# Patient Record
Sex: Female | Born: 1950 | Race: White | Hispanic: No | Marital: Married | Smoking: Never smoker
Health system: Southern US, Community
[De-identification: ages and names within clinical notes are randomized; demographics above are authoritative.]

## PROBLEM LIST (undated history)

## (undated) DIAGNOSIS — K760 Fatty (change of) liver, not elsewhere classified: Secondary | ICD-10-CM

## (undated) DIAGNOSIS — H3560 Retinal hemorrhage, unspecified eye: Secondary | ICD-10-CM

## (undated) DIAGNOSIS — M810 Age-related osteoporosis without current pathological fracture: Secondary | ICD-10-CM

## (undated) DIAGNOSIS — M84376A Stress fracture, unspecified foot, initial encounter for fracture: Secondary | ICD-10-CM

## (undated) HISTORY — DX: Age-related osteoporosis without current pathological fracture: M81.0

## (undated) HISTORY — DX: Fatty (change of) liver, not elsewhere classified: K76.0

## (undated) HISTORY — DX: Stress fracture, unspecified foot, initial encounter for fracture: M84.376A

## (undated) HISTORY — DX: Retinal hemorrhage, unspecified eye: H35.60

## (undated) HISTORY — PX: BREAST LUMPECTOMY: SHX2

---

## 2001-10-01 ENCOUNTER — Other Ambulatory Visit: Admission: RE | Admit: 2001-10-01 | Discharge: 2001-10-01 | Payer: Self-pay | Admitting: Radiology

## 2004-07-18 ENCOUNTER — Other Ambulatory Visit: Admission: RE | Admit: 2004-07-18 | Discharge: 2004-07-18 | Payer: Self-pay | Admitting: Gynecology

## 2009-01-22 ENCOUNTER — Ambulatory Visit (HOSPITAL_COMMUNITY): Admission: RE | Admit: 2009-01-22 | Discharge: 2009-01-22 | Payer: Self-pay | Admitting: Gynecology

## 2012-06-18 ENCOUNTER — Other Ambulatory Visit (HOSPITAL_COMMUNITY): Payer: Self-pay | Admitting: Specialist

## 2012-06-18 DIAGNOSIS — M79604 Pain in right leg: Secondary | ICD-10-CM

## 2012-06-18 DIAGNOSIS — M79671 Pain in right foot: Secondary | ICD-10-CM

## 2012-06-21 ENCOUNTER — Encounter (HOSPITAL_COMMUNITY)
Admission: RE | Admit: 2012-06-21 | Discharge: 2012-06-21 | Disposition: A | Discharge status: Home / Self Care | Payer: Managed Care, Other (non HMO) | Source: Ambulatory Visit | Attending: Specialist | Admitting: Specialist

## 2012-06-21 DIAGNOSIS — M79671 Pain in right foot: Secondary | ICD-10-CM

## 2012-06-21 DIAGNOSIS — M79609 Pain in unspecified limb: Secondary | ICD-10-CM | POA: Insufficient documentation

## 2012-06-21 DIAGNOSIS — M79604 Pain in right leg: Secondary | ICD-10-CM

## 2012-06-21 MED ORDER — TECHNETIUM TC 99M MEDRONATE IV KIT
25.0000 | PACK | Freq: Once | INTRAVENOUS | Status: AC | PRN
  Administered 2012-06-21: 25 via INTRAVENOUS

## 2012-07-01 ENCOUNTER — Other Ambulatory Visit: Payer: Self-pay | Admitting: Specialist

## 2012-07-01 DIAGNOSIS — M79671 Pain in right foot: Secondary | ICD-10-CM

## 2012-07-04 ENCOUNTER — Ambulatory Visit
Admission: RE | Admit: 2012-07-04 | Discharge: 2012-07-04 | Disposition: A | Discharge status: Home / Self Care | Payer: Managed Care, Other (non HMO) | Source: Ambulatory Visit | Attending: Specialist | Admitting: Specialist

## 2012-07-04 DIAGNOSIS — M79671 Pain in right foot: Secondary | ICD-10-CM

## 2014-04-28 ENCOUNTER — Other Ambulatory Visit: Payer: Self-pay | Admitting: Endocrinology

## 2014-04-28 ENCOUNTER — Encounter: Payer: Self-pay | Admitting: Gastroenterology

## 2014-04-28 DIAGNOSIS — R51 Headache: Secondary | ICD-10-CM

## 2014-05-04 ENCOUNTER — Ambulatory Visit
Admission: RE | Admit: 2014-05-04 | Discharge: 2014-05-04 | Disposition: A | Discharge status: Home / Self Care | Payer: Managed Care, Other (non HMO) | Source: Ambulatory Visit | Attending: Endocrinology | Admitting: Endocrinology

## 2014-05-04 ENCOUNTER — Encounter (INDEPENDENT_AMBULATORY_CARE_PROVIDER_SITE_OTHER): Payer: Self-pay

## 2014-05-04 DIAGNOSIS — R51 Headache: Secondary | ICD-10-CM

## 2014-05-04 MED ORDER — IOHEXOL 300 MG/ML  SOLN
75.0000 mL | Freq: Once | INTRAMUSCULAR | Status: AC | PRN
  Administered 2014-05-04: 75 mL via INTRAVENOUS

## 2014-06-14 ENCOUNTER — Emergency Department (HOSPITAL_BASED_OUTPATIENT_CLINIC_OR_DEPARTMENT_OTHER)
Admission: EM | Admit: 2014-06-14 | Discharge: 2014-06-14 | Disposition: A | Discharge status: Home / Self Care | Payer: Managed Care, Other (non HMO) | Attending: Emergency Medicine | Admitting: Emergency Medicine

## 2014-06-14 ENCOUNTER — Encounter (HOSPITAL_BASED_OUTPATIENT_CLINIC_OR_DEPARTMENT_OTHER): Payer: Self-pay | Admitting: Emergency Medicine

## 2014-06-14 DIAGNOSIS — R110 Nausea: Secondary | ICD-10-CM | POA: Insufficient documentation

## 2014-06-14 DIAGNOSIS — Z79899 Other long term (current) drug therapy: Secondary | ICD-10-CM | POA: Insufficient documentation

## 2014-06-14 DIAGNOSIS — R509 Fever, unspecified: Secondary | ICD-10-CM | POA: Insufficient documentation

## 2014-06-14 DIAGNOSIS — R197 Diarrhea, unspecified: Secondary | ICD-10-CM | POA: Insufficient documentation

## 2014-06-14 LAB — COMPREHENSIVE METABOLIC PANEL
ALT: 20 U/L (ref 0–35)
AST: 13 U/L (ref 0–37)
Albumin: 3.4 g/dL — ABNORMAL LOW (ref 3.5–5.2)
Alkaline Phosphatase: 65 U/L (ref 39–117)
Anion gap: 11 (ref 5–15)
BILIRUBIN TOTAL: 0.3 mg/dL (ref 0.3–1.2)
BUN: 14 mg/dL (ref 6–23)
CALCIUM: 9.6 mg/dL (ref 8.4–10.5)
CHLORIDE: 105 meq/L (ref 96–112)
CO2: 28 meq/L (ref 19–32)
Creatinine, Ser: 0.8 mg/dL (ref 0.50–1.10)
GFR, EST AFRICAN AMERICAN: 90 mL/min — AB (ref >90)
GFR, EST NON AFRICAN AMERICAN: 77 mL/min — AB (ref >90)
GLUCOSE: 118 mg/dL — AB (ref 70–99)
Potassium: 3.8 mEq/L (ref 3.7–5.3)
SODIUM: 144 meq/L (ref 137–147)
Total Protein: 7 g/dL (ref 6.0–8.3)

## 2014-06-14 LAB — URINALYSIS, ROUTINE W REFLEX MICROSCOPIC
BILIRUBIN URINE: NEGATIVE
GLUCOSE, UA: NEGATIVE mg/dL
HGB URINE DIPSTICK: NEGATIVE
KETONES UR: NEGATIVE mg/dL
Leukocytes, UA: NEGATIVE
Nitrite: NEGATIVE
PH: 7 (ref 5.0–8.0)
PROTEIN: NEGATIVE mg/dL
Specific Gravity, Urine: 1.015 (ref 1.005–1.030)
Urobilinogen, UA: 0.2 mg/dL (ref 0.0–1.0)

## 2014-06-14 LAB — CBC
HCT: 36 % (ref 36.0–46.0)
HEMOGLOBIN: 12.2 g/dL (ref 12.0–15.0)
MCH: 30.9 pg (ref 26.0–34.0)
MCHC: 33.9 g/dL (ref 30.0–36.0)
MCV: 91.1 fL (ref 78.0–100.0)
PLATELETS: 296 10*3/uL (ref 150–400)
RBC: 3.95 MIL/uL (ref 3.87–5.11)
RDW: 12.6 % (ref 11.5–15.5)
WBC: 8 10*3/uL (ref 4.0–10.5)

## 2014-06-14 LAB — I-STAT CG4 LACTIC ACID, ED: Lactic Acid, Venous: 0.48 mmol/L — ABNORMAL LOW (ref 0.5–2.2)

## 2014-06-14 MED ORDER — ONDANSETRON 4 MG PO TBDP: ORAL_TABLET | ORAL | Status: DC

## 2014-06-14 MED ORDER — SODIUM CHLORIDE 0.9 % IV BOLUS (SEPSIS)
1000.0000 mL | Freq: Once | INTRAVENOUS | Status: AC
Start: 2014-06-14 — End: 2014-06-14
  Administered 2014-06-14: 1000 mL via INTRAVENOUS

## 2014-06-14 MED ORDER — SODIUM CHLORIDE 0.9 % IV BOLUS (SEPSIS)
1000.0000 mL | Freq: Once | INTRAVENOUS | Status: AC
  Administered 2014-06-14: 1000 mL via INTRAVENOUS

## 2014-06-14 MED ORDER — ONDANSETRON HCL 4 MG/2ML IJ SOLN
4.0000 mg | Freq: Once | INTRAMUSCULAR | Status: AC
  Administered 2014-06-14: 4 mg via INTRAVENOUS
  Filled 2014-06-14: qty 2

## 2014-06-14 NOTE — ED Notes (Signed)
Pt states diarrhea since last Saturday along with nausea, no vomiting.  Called PMD on Tuesday and given a Rx for Promethazine for nausea.  Pt states decreased appetite and estimated weight loss of 10lbs in past week.  Pt denies abdominal pain or cramping.

## 2014-06-14 NOTE — Discharge Instructions (Signed)

## 2014-06-14 NOTE — ED Notes (Signed)
States that she has been having nausea and diarrhea for several days. States that she has lost 10 lbs this week.

## 2014-06-14 NOTE — ED Provider Notes (Signed)
CSN: [id number]     Arrival date & time 06/14/14  1325 History   First MD Initiated Contact with Patient 06/14/14 1339     Chief Complaint  Patient presents with  . Diarrhea     (Consider location/radiation/quality/duration/timing/severity/associated sxs/prior Treatment) Patient is a 63 y.o. female presenting with diarrhea. The history is provided by the patient.  Diarrhea Quality:  Semi-solid and watery Severity:  Moderate Onset quality:  Sudden Number of episodes:  9 Timing:  Constant Progression:  Improving Relieved by:  Nothing Worsened by:  Nothing tried Associated symptoms: fever (101 first 4 days of her illness, none in 5 days)   Associated symptoms: no abdominal pain, no chills, no headaches, no myalgias, no URI and no vomiting   Risk factors: no recent antibiotic use, no sick contacts, no suspicious food intake and no travel to endemic areas     History reviewed. No pertinent past medical history. Past Surgical History  Procedure Laterality Date  . Breast lumpectomy     No family history on file. History  Substance Use Topics  . Smoking status: Never Smoker   . Smokeless tobacco: Not on file  . Alcohol Use: Yes     Comment: ocassional   OB History   Grav Para Term Preterm Abortions TAB SAB Ect Mult Living                 Review of Systems  Constitutional: Positive for fever (101 first 4 days of her illness, none in 5 days). Negative for chills.  Respiratory: Negative for cough and shortness of breath.   Cardiovascular: Negative for chest pain and leg swelling.  Gastrointestinal: Positive for nausea and diarrhea. Negative for vomiting and abdominal pain.  Musculoskeletal: Negative for myalgias.  Neurological: Negative for headaches.  All other systems reviewed and are negative.     Allergies  Review of patient's allergies indicates no known allergies.  Home Medications   Prior to Admission medications   Medication Sig Start Date End Date Taking?  Authorizing Provider  raloxifene (EVISTA) 60 MG tablet Take 60 mg by mouth daily.   Yes Historical Provider, MD   BP 143/88  Pulse 76  Temp(Src) 98.1 F (36.7 C) (Oral)  Resp 16  Ht 5\' 3"  (1.6 m)  Wt 132 lb (59.875 kg)  BMI 23.39 kg/m2  SpO2 98% Physical Exam  Nursing note and vitals reviewed. Constitutional: She is oriented to person, place, and time. She appears well-developed and well-nourished. No distress.  HENT:  Head: Normocephalic and atraumatic.  Mouth/Throat: Oropharynx is clear and moist. No oropharyngeal exudate.  Eyes: EOM are normal. Pupils are equal, round, and reactive to light.  Neck: Normal range of motion. Neck supple.  Cardiovascular: Normal rate and regular rhythm.  Exam reveals no friction rub.   No murmur heard. Pulmonary/Chest: Effort normal and breath sounds normal. No respiratory distress. She has no wheezes. She has no rales.  Abdominal: Soft. She exhibits no distension. There is no tenderness. There is no rebound.  Musculoskeletal: Normal range of motion. She exhibits no edema.  Neurological: She is alert and oriented to person, place, and time. No cranial nerve deficit. She exhibits normal muscle tone. Coordination normal.  Skin: No rash noted. She is not diaphoretic.    ED Course  Procedures (including critical care time) Labs Review Labs Reviewed  COMPREHENSIVE METABOLIC PANEL - Abnormal; Notable for the following:    Glucose, Bld 118 (*)    Albumin 3.4 (*)    GFR calc  non Af Amer 77 (*)    GFR calc Af Amer 90 (*)    All other components within normal limits  I-STAT CG4 LACTIC ACID, ED - Abnormal; Notable for the following:    Lactic Acid, Venous 0.48 (*)    All other components within normal limits  CBC  URINALYSIS, ROUTINE W REFLEX MICROSCOPIC    Imaging Review No results found.   EKG Interpretation None      MDM   Final diagnoses:  Diarrhea    14F here with diarrhea. Present for past 9 days, slowly improving. Nausea also  but no vomiting. Had fevers for first few days. No fevers, no dysuria. No abdominal pain. Decreased urination. Patient denies recent travel, antibiotic use, drinking unfiltered water.  Here with benign exam, stable vitals. Will check labs. Zofran given - PCP had called in phenergan, but symptoms not improved with phenergan. Feeling better with Zofran. Labs ok. Can f/u with Dr. Evlyn Kanner in next 1-2 days. Stable for discharge.  Elwin Mocha, MD 06/14/14 [phone]

## 2014-06-22 ENCOUNTER — Ambulatory Visit (AMBULATORY_SURGERY_CENTER): Payer: Self-pay | Admitting: *Deleted

## 2014-06-22 VITALS — Ht 63.5 in | Wt 133.2 lb

## 2014-06-22 DIAGNOSIS — Z1211 Encounter for screening for malignant neoplasm of colon: Secondary | ICD-10-CM

## 2014-06-22 MED ORDER — NA SULFATE-K SULFATE-MG SULF 17.5-3.13-1.6 GM/177ML PO SOLN: ORAL | Status: DC

## 2014-06-22 NOTE — Progress Notes (Signed)
Patient denies any allergies to eggs or soy. Patient denies any problems with anesthesia/sedation. Patient denies any oxygen use at home and does not take any diet/weight loss medications. EMMI education assisgned to patient on colonoscopy, this was explained and instructions given to patient. 

## 2014-06-29 ENCOUNTER — Encounter: Payer: Self-pay | Admitting: Gastroenterology

## 2014-07-01 ENCOUNTER — Ambulatory Visit (AMBULATORY_SURGERY_CENTER): Payer: Managed Care, Other (non HMO) | Admitting: Gastroenterology

## 2014-07-01 ENCOUNTER — Encounter: Payer: Self-pay | Admitting: Gastroenterology

## 2014-07-01 VITALS — BP 118/68 | HR 58 | Temp 98.0°F | Resp 15 | Ht 63.5 in | Wt 133.0 lb

## 2014-07-01 DIAGNOSIS — Z1211 Encounter for screening for malignant neoplasm of colon: Secondary | ICD-10-CM

## 2014-07-01 MED ORDER — SODIUM CHLORIDE 0.9 % IV SOLN: 500.0000 mL | INTRAVENOUS | Status: DC

## 2014-07-01 NOTE — Patient Instructions (Signed)
Findings:  Normal Recommendations:  Repeat colonoscopy in 10 years  YOU HAD AN ENDOSCOPIC PROCEDURE TODAY AT THE [facility]: Refer to the procedure report that was given to you for any specific questions about what was found during the examination.  If the procedure report does not answer your questions, please call your gastroenterologist to clarify.  If you requested that your care partner not be given the details of your procedure findings, then the procedure report has been included in a sealed envelope for you to review at your convenience later.  YOU SHOULD EXPECT: Some feelings of bloating in the abdomen. Passage of more gas than usual.  Walking can help get rid of the air that was put into your GI tract during the procedure and reduce the bloating. If you had a lower endoscopy (such as a colonoscopy or flexible sigmoidoscopy) you may notice spotting of blood in your stool or on the toilet paper. If you underwent a bowel prep for your procedure, then you may not have a normal bowel movement for a few days.  DIET: Your first meal following the procedure should be a light meal and then it is ok to progress to your normal diet.  A half-sandwich or bowl of soup is an example of a good first meal.  Heavy or fried foods are harder to digest and may make you feel nauseous or bloated.  Likewise meals heavy in dairy and vegetables can cause extra gas to form and this can also increase the bloating.  Drink plenty of fluids but you should avoid alcoholic beverages for 24 hours.  ACTIVITY: Your care partner should take you home directly after the procedure.  You should plan to take it easy, moving slowly for the rest of the day.  You can resume normal activity the day after the procedure however you should NOT DRIVE or use heavy machinery for 24 hours (because of the sedation medicines used during the test).    SYMPTOMS TO REPORT IMMEDIATELY: A gastroenterologist can be reached at any hour.   During normal business hours, 8:30 AM to 5:00 PM Monday through Friday, call [phone].  After hours and on weekends, please call the GI answering service at [phone] who will take a message and have the physician on call contact you.   Following lower endoscopy (colonoscopy or flexible sigmoidoscopy):  Excessive amounts of blood in the stool  Significant tenderness or worsening of abdominal pains  Swelling of the abdomen that is new, acute  Fever of 100F or higher  Following upper endoscopy (EGD)  Vomiting of blood or coffee ground material  New chest pain or pain under the shoulder blades  Painful or persistently difficult swallowing  New shortness of breath  Fever of 100F or higher  Black, tarry-looking stools  FOLLOW UP: If any biopsies were taken you will be contacted by phone or by letter within the next 1-3 weeks.  Call your gastroenterologist if you have not heard about the biopsies in 3 weeks.  Our staff will call the home number listed on your records the next business day following your procedure to check on you and address any questions or concerns that you may have at that time regarding the information given to you following your procedure. This is a courtesy call and so if there is no answer at the home number and we have not heard from you through the emergency physician on call, we will assume that you have returned to your   regular daily activities without incident.  SIGNATURES/CONFIDENTIALITY: You and/or your care partner have signed paperwork which will be entered into your electronic medical record.  These signatures attest to the fact that that the information above on your After Visit Summary has been reviewed and is understood.  Full responsibility of the confidentiality of this discharge information lies with you and/or your care-partner.  Please follow all discharge instructions given to you by the recovery room nurse. If you have any questions or  problems after discharge please call one of the numbers listed above. You will receive a phone call in the am to see how you are doing and answer any questions you may have. Thank you for choosing [facility] for your health care needs. 

## 2014-07-01 NOTE — Op Note (Signed)
[facility] [address] [place], [address]   COLONOSCOPY PROCEDURE REPORT  PATIENT: Victoria Carlson, Victoria Carlson  MR#: [id number] BIRTHDATE: 25-Apr-1951 , 63  yrs. old GENDER: Female ENDOSCOPIST: Louis Meckel, MD REFERRED AO:[id number] Evlyn Kanner, M.D. PROCEDURE DATE:  07/01/2014 PROCEDURE:   Colonoscopy, diagnostic First Screening Colonoscopy - Avg.  risk and is 50 yrs.  old or older Yes.  Prior Negative Screening - Now for repeat screening. N/A  History of Adenoma - Now for follow-up colonoscopy & has been > or = to 3 yrs.  N/A  Polyps Removed Today? No.  Recommend repeat exam, <10 yrs? No. ASA CLASS:   Class I INDICATIONS:average risk screening. MEDICATIONS: MAC sedation, administered by CRNA and propofol (Diprivan)  IV  DESCRIPTION OF PROCEDURE:   After the risks benefits and alternatives of the procedure were thoroughly explained, informed consent was obtained.  A digital rectal exam revealed no abnormalities of the rectum.   The LB [id number]  endoscope was introduced through the anus and advanced to the cecum, which was identified by both the appendix and ileocecal valve. No adverse events experienced.   The quality of the prep was excellent using Suprep  The instrument was then slowly withdrawn as the colon was fully examined.      COLON FINDINGS: A normal appearing cecum, ileocecal valve, and appendiceal orifice were identified.  The ascending, hepatic flexure, transverse, splenic flexure, descending, sigmoid colon and rectum appeared unremarkable.  No polyps or cancers were seen. Retroflexed views revealed no abnormalities. The time to cecum=3 minutes 33 seconds.  Withdrawal time=9 minutes 14 seconds.  The scope was withdrawn and the procedure completed. COMPLICATIONS: There were no complications.  ENDOSCOPIC IMPRESSION: Normal colon  RECOMMENDATIONS: Continue current colorectal screening recommendations for "routine risk" patients with a  repeat colonoscopy in 10 years.   eSigned:  Louis Meckel, MD 07/01/2014 10:25 AM   cc:

## 2014-07-01 NOTE — Progress Notes (Signed)
A/ox3 pleased with MAC, report to Tracey RN 

## 2014-07-02 ENCOUNTER — Telehealth: Payer: Self-pay | Admitting: *Deleted

## 2014-07-02 NOTE — Telephone Encounter (Signed)
  Follow up Call-  Call back number 07/01/2014  Post procedure Call Back phone  # [phone]  Permission to leave phone message Yes     Patient questions:  Do you have a fever, pain , or abdominal swelling? No. Pain Score  0 *  Have you tolerated food without any problems? Yes.    Have you been able to return to your normal activities? Yes.    Do you have any questions about your discharge instructions: Diet   No. Medications  No. Follow up visit  No.  Do you have questions or concerns about your Care? No.  Actions: * If pain score is 4 or above: No action needed, pain <4.

## 2016-01-08 IMAGING — CT CT HEAD WO/W CM
1 of 2 series · 13 of 30 positions shown, 17 images · IV contrast (75CC OMNI 300)
Comparison: None.

CLINICAL DATA: Headache

EXAM:
CT HEAD WITHOUT AND WITH CONTRAST
TECHNIQUE: Contiguous axial images were obtained from the base of the skull
through the vertex without and with intravenous contrast
CONTRAST:  75mL OMNIPAQUE IOHEXOL 300 MG/ML  SOLN

[Series 32: 3d filtered head w/o · axial · non-contrast · 0.49mm/px · z∈[+13,+144]mm · 13 of 32 slices shown, 17 images]
[im 3/32  brain]
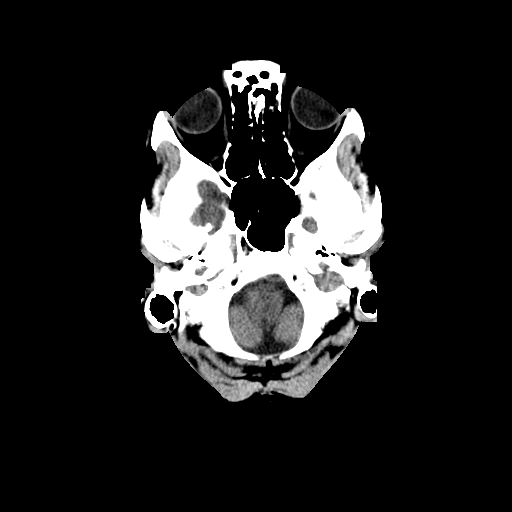
[im 3/32  bone]
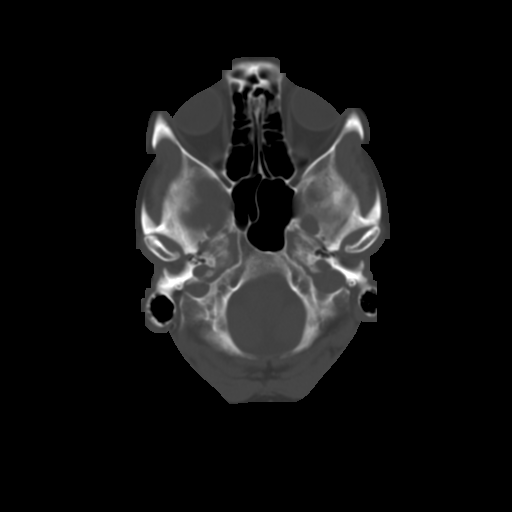
[im 5/32  brain]
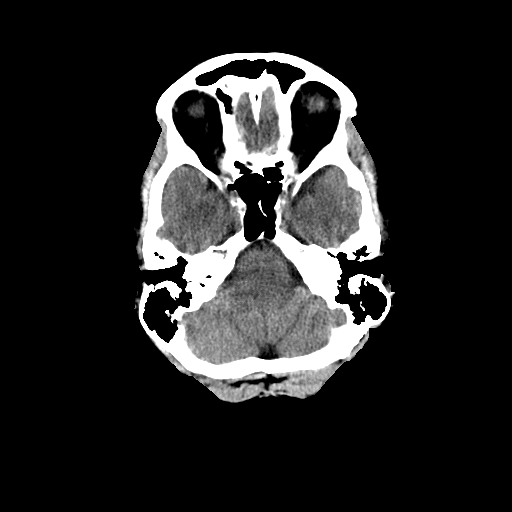
[im 7/32  brain]
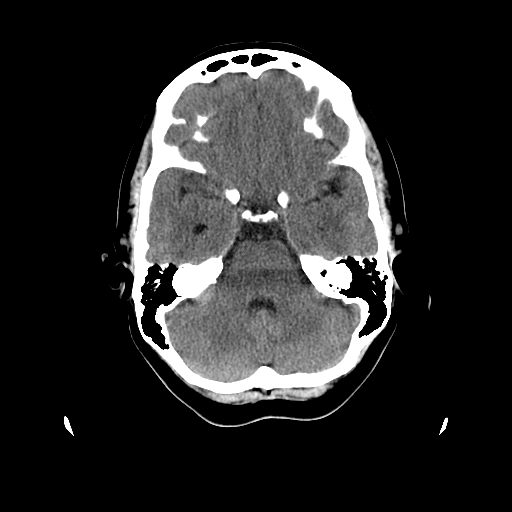
[im 9/32  brain]
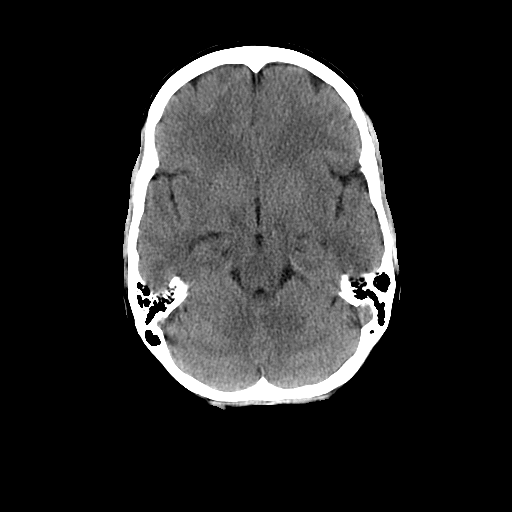
[im 12/32  brain]
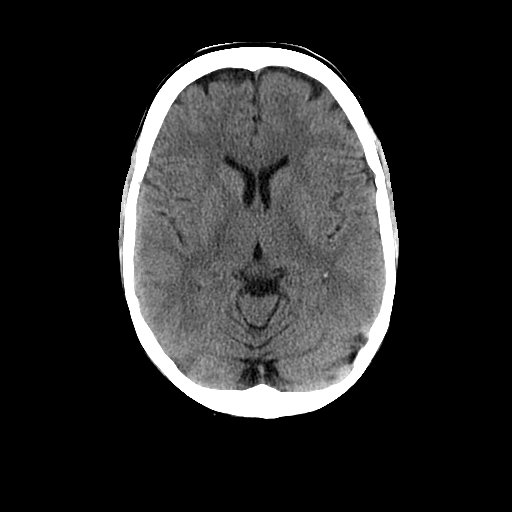
[im 12/32  bone]
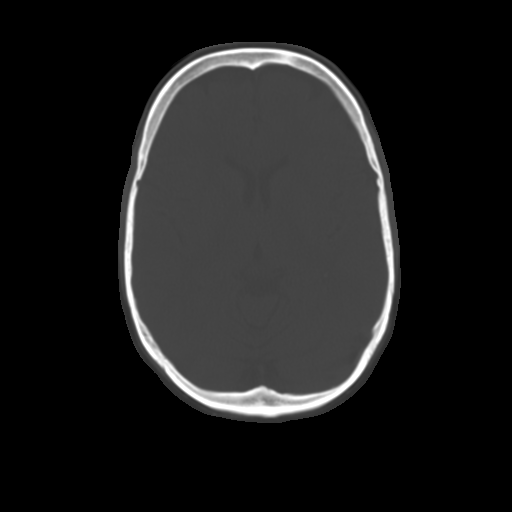
[im 14/32  brain]
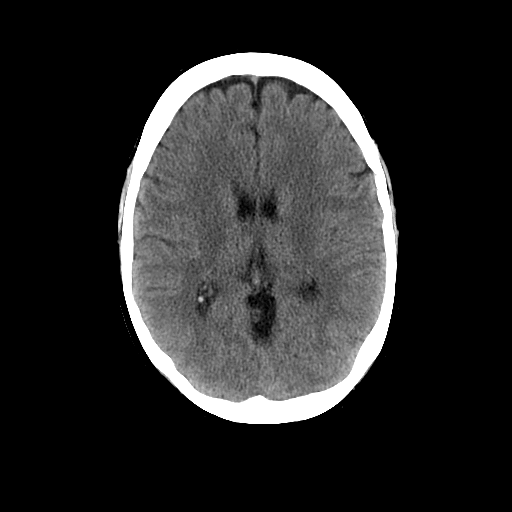
[im 16/32  brain]
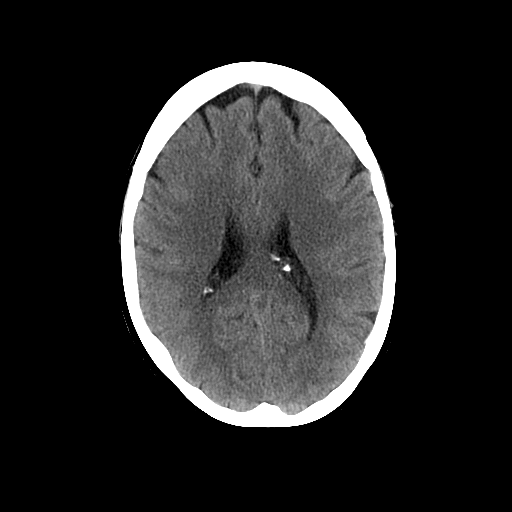
[im 18/32  brain]
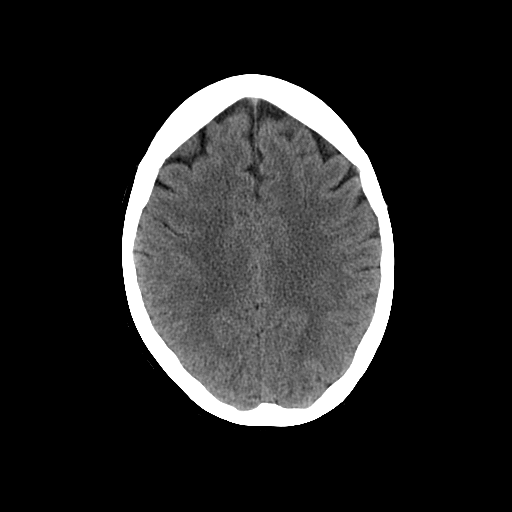
[im 20/32  brain]
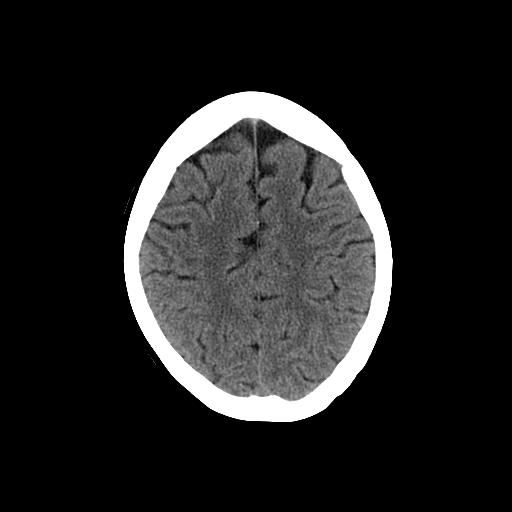
[im 20/32  bone]
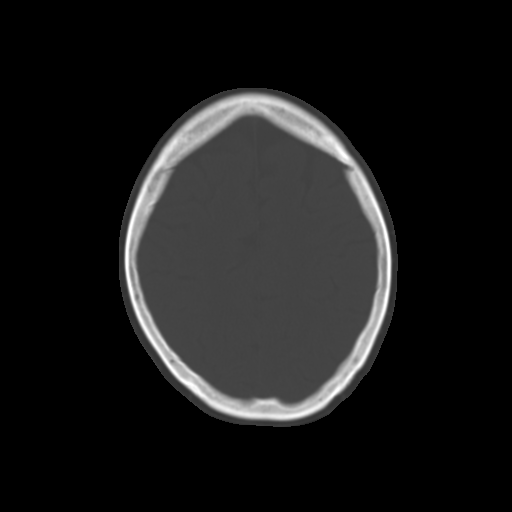
[im 23/32  brain]
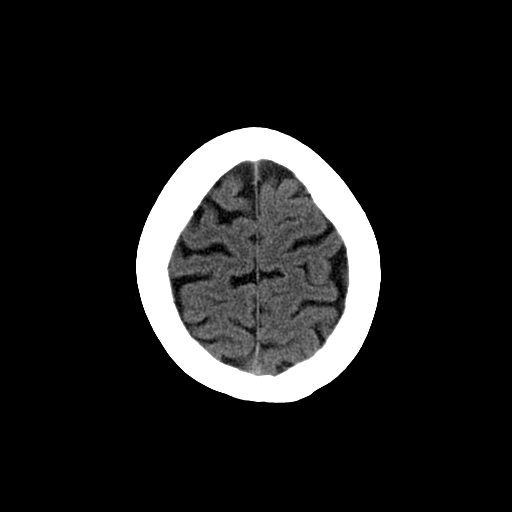
[im 25/32  brain]
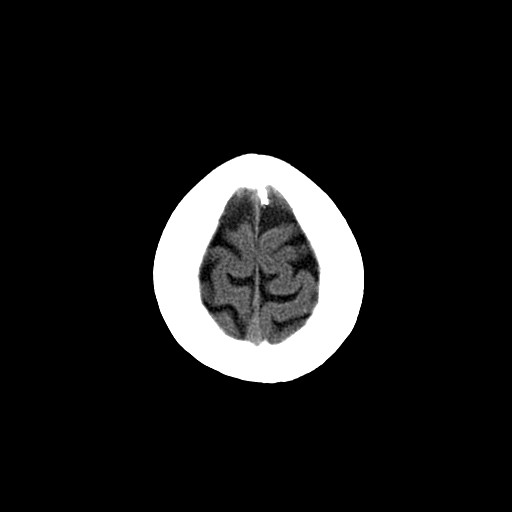
[im 27/32  brain]
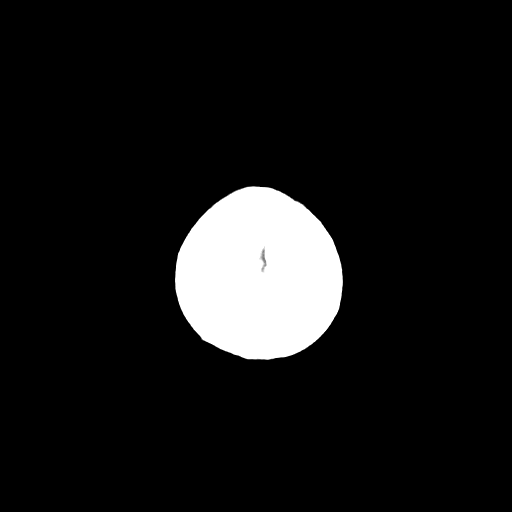
[im 29/32  brain]
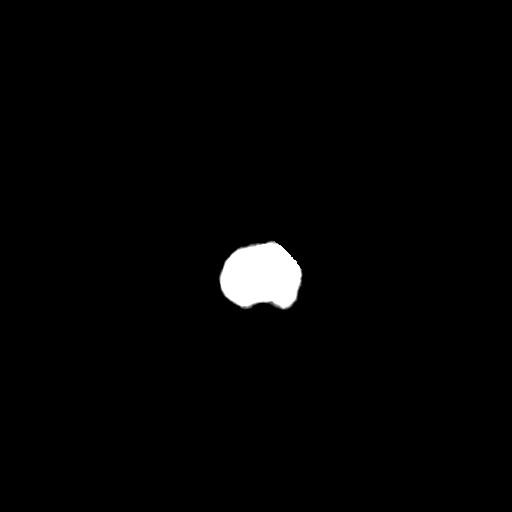
[im 29/32  bone]
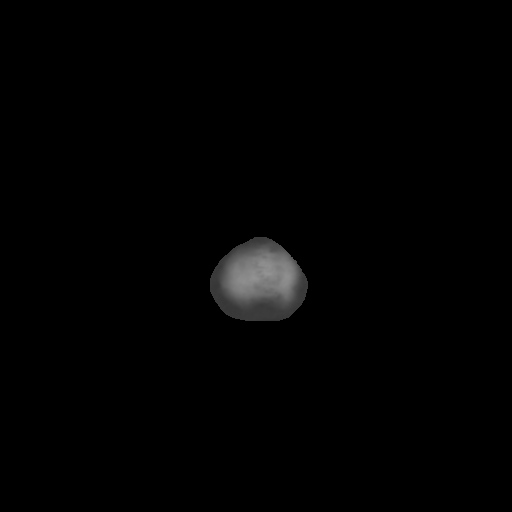

[13 of 30 positions shown; findings below may reference images not displayed]

FINDINGS: Ventricle size is normal. Negative for acute infarct. No significant
chronic ischemia. Negative for hemorrhage or mass.

Postcontrast imaging reveals no enhancing mass lesion. Note is made
of a small right transverse sinus and small right sigmoid sinus
compatible with congenital hypoplasia. No dural sinus thrombosis is
identified. Small filling defect left transverse sinus felt to be
arachnoid granulation.
IMPRESSION: Negative

## 2018-06-06 ENCOUNTER — Other Ambulatory Visit: Payer: Self-pay | Admitting: Endocrinology

## 2018-06-06 DIAGNOSIS — R740 Nonspecific elevation of levels of transaminase and lactic acid dehydrogenase [LDH]: Principal | ICD-10-CM

## 2018-06-06 DIAGNOSIS — R7401 Elevation of levels of liver transaminase levels: Secondary | ICD-10-CM

## 2018-08-06 ENCOUNTER — Ambulatory Visit
Admission: RE | Admit: 2018-08-06 | Discharge: 2018-08-06 | Disposition: A | Discharge status: Home / Self Care | Payer: Managed Care, Other (non HMO) | Source: Ambulatory Visit | Attending: Endocrinology | Admitting: Endocrinology

## 2018-08-06 DIAGNOSIS — R7401 Elevation of levels of liver transaminase levels: Secondary | ICD-10-CM

## 2018-08-06 DIAGNOSIS — R740 Nonspecific elevation of levels of transaminase and lactic acid dehydrogenase [LDH]: Principal | ICD-10-CM

## 2019-01-09 ENCOUNTER — Other Ambulatory Visit: Payer: Self-pay | Admitting: Obstetrics & Gynecology

## 2019-01-09 DIAGNOSIS — R1031 Right lower quadrant pain: Secondary | ICD-10-CM

## 2019-01-15 ENCOUNTER — Other Ambulatory Visit: Payer: Self-pay | Admitting: Obstetrics & Gynecology

## 2019-01-15 DIAGNOSIS — R1031 Right lower quadrant pain: Secondary | ICD-10-CM

## 2019-01-17 ENCOUNTER — Other Ambulatory Visit: Payer: Self-pay

## 2019-01-17 ENCOUNTER — Ambulatory Visit
Admission: RE | Admit: 2019-01-17 | Discharge: 2019-01-17 | Disposition: A | Discharge status: Home / Self Care | Payer: Medicare Other | Source: Ambulatory Visit | Attending: Obstetrics & Gynecology | Admitting: Obstetrics & Gynecology

## 2019-01-17 DIAGNOSIS — R1031 Right lower quadrant pain: Secondary | ICD-10-CM

## 2019-01-17 MED ORDER — IOPAMIDOL (ISOVUE-300) INJECTION 61%
100.0000 mL | Freq: Once | INTRAVENOUS | Status: AC | PRN
  Administered 2019-01-17: 100 mL via INTRAVENOUS

## 2020-03-27 IMAGING — US US ABDOMEN LIMITED
1 series · 14 of 25 positions shown · non-contrast
Comparison: Ultrasound 01/22/2009.

CLINICAL DATA: Transaminitis.

EXAM:
ULTRASOUND ABDOMEN LIMITED RIGHT UPPER QUADRANT

[Series 1: us abdomen limited · 0.17mm/px · 14 of 48 slices shown]
[im 1/48]
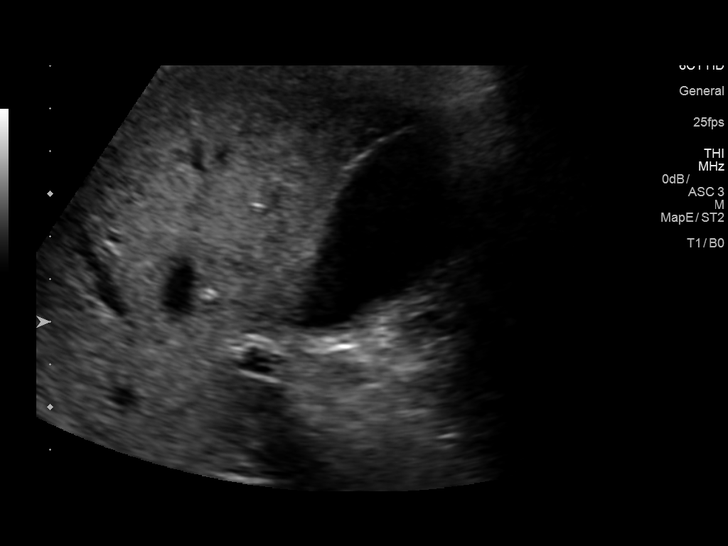
[im 4/48]
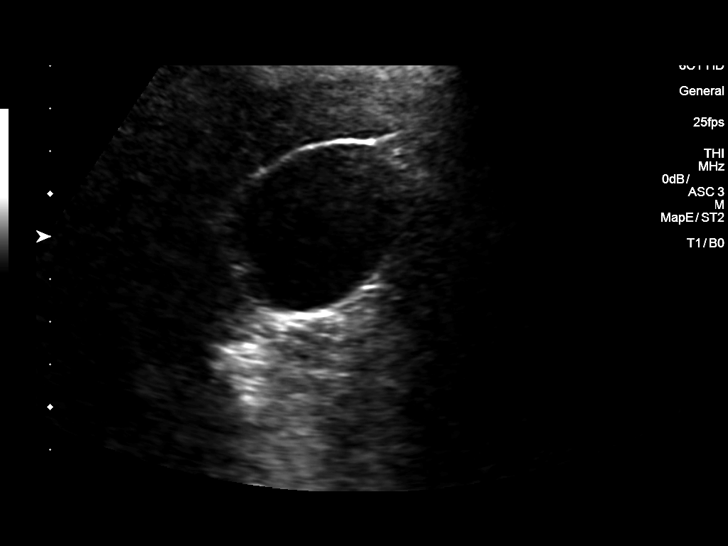
[im 8/48]
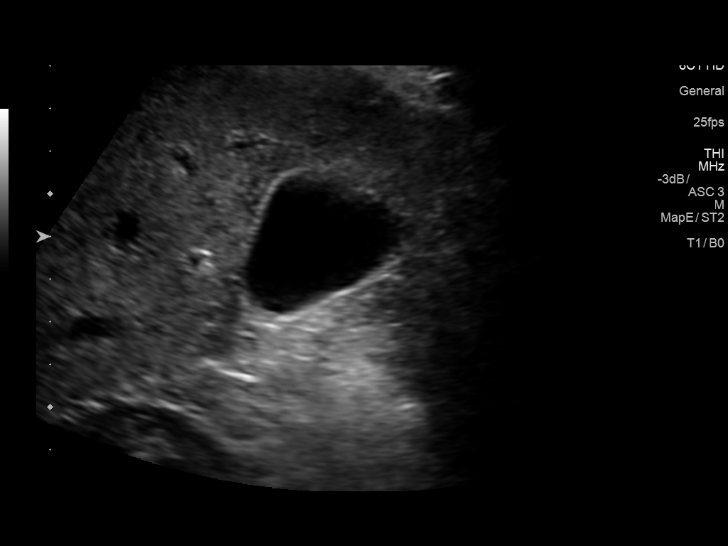
[im 12/48]
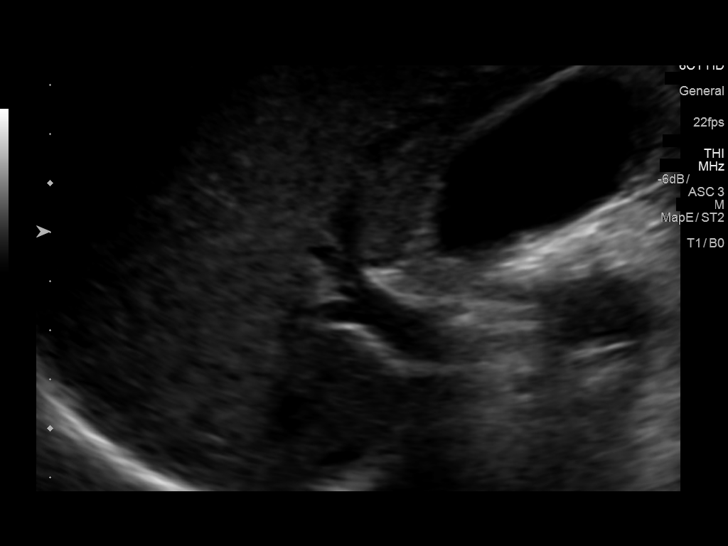
[im 16/48]
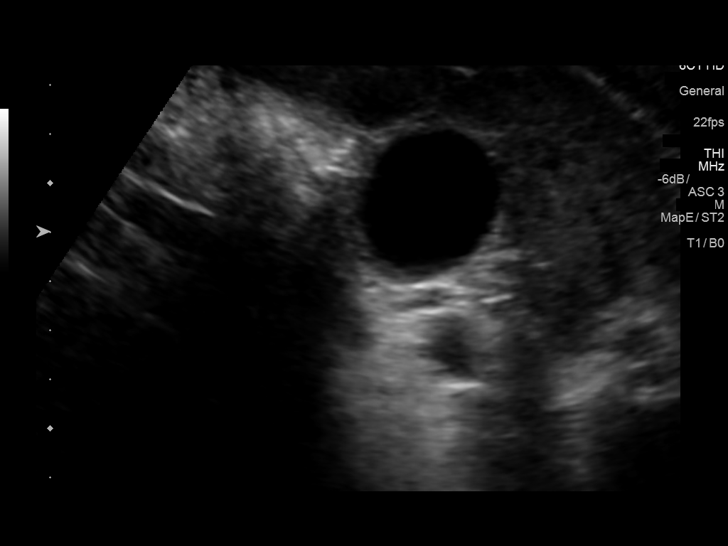
[im 18/48]
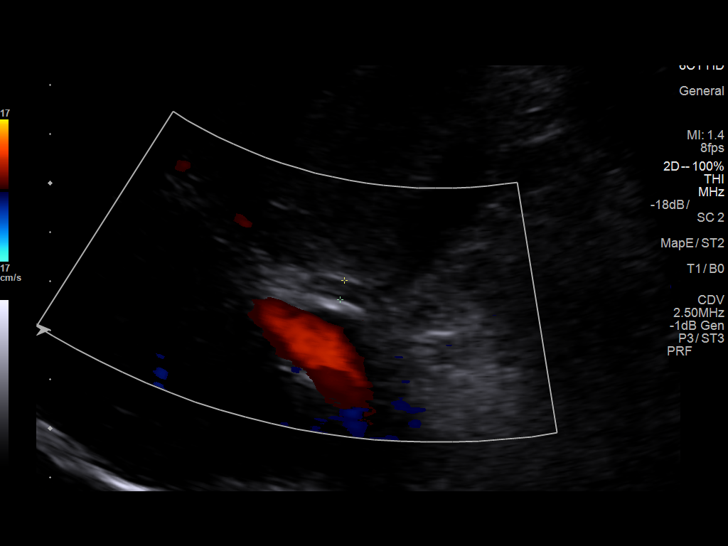
[im 22/48]
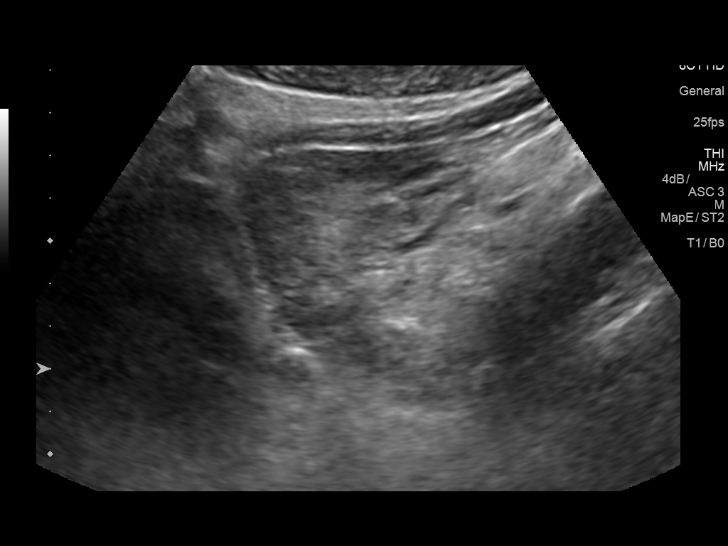
[im 26/48]
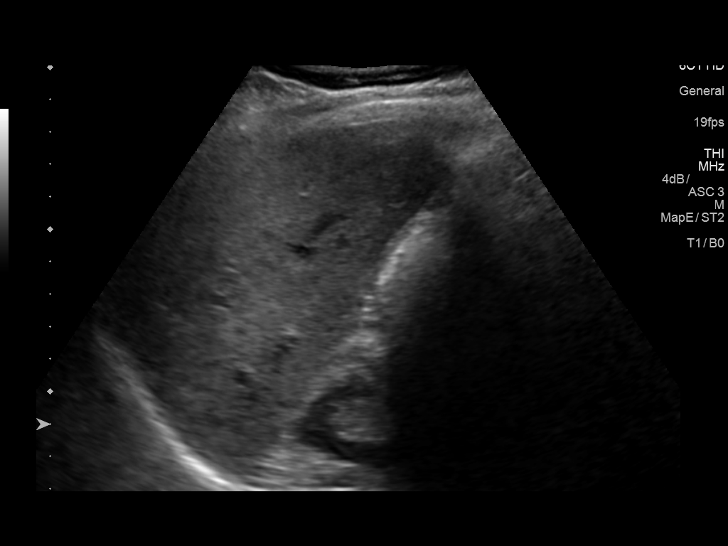
[im 30/48]
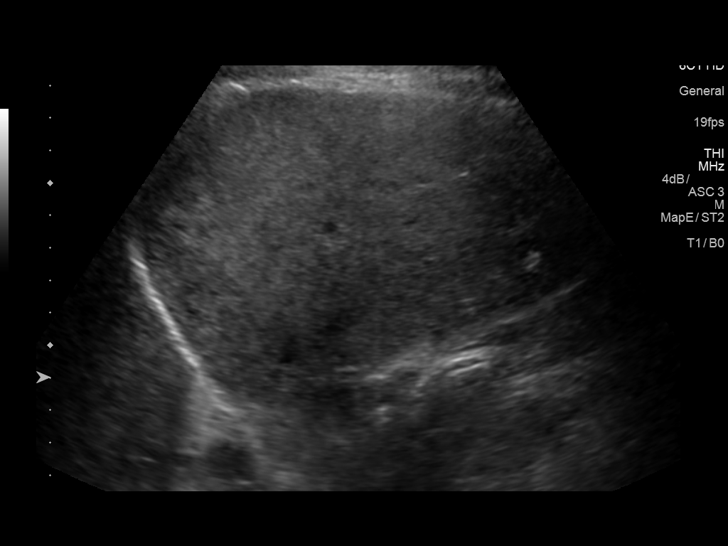
[im 32/48]
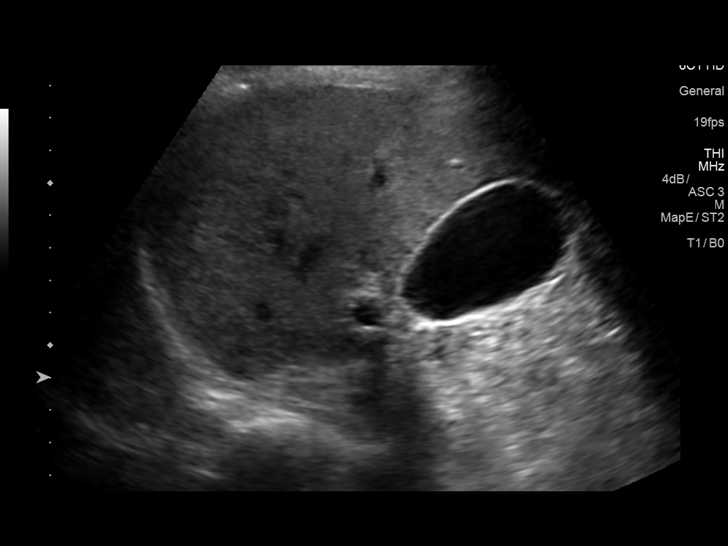
[im 36/48]
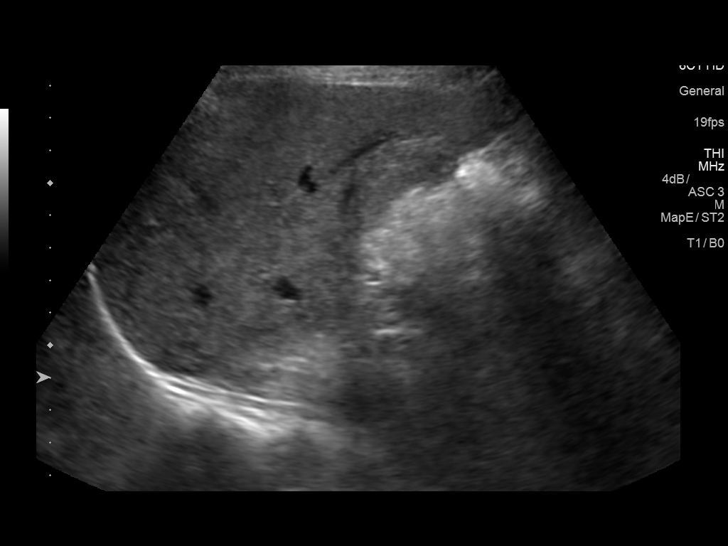
[im 40/48]
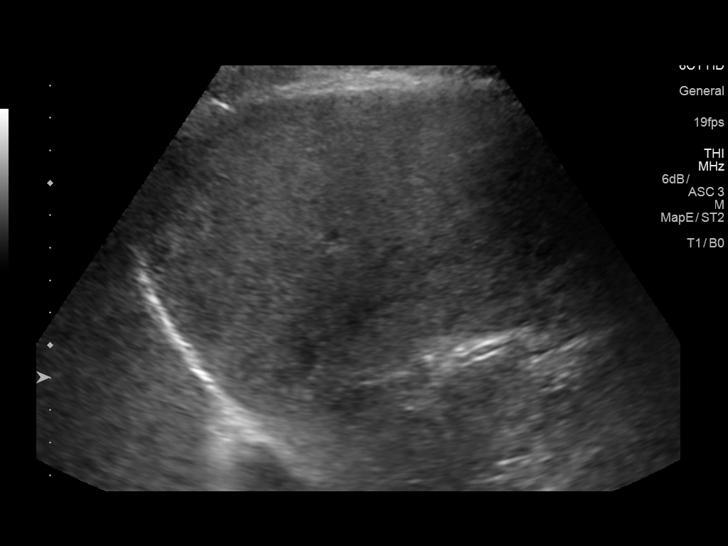
[im 44/48]
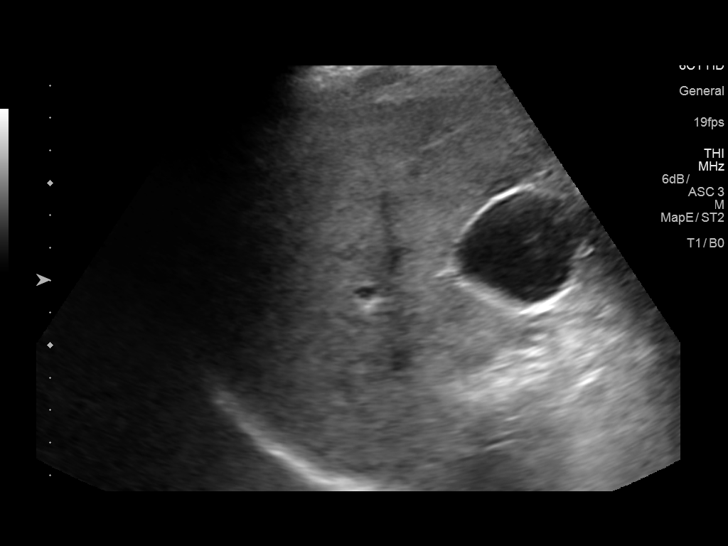
[im 48/48]
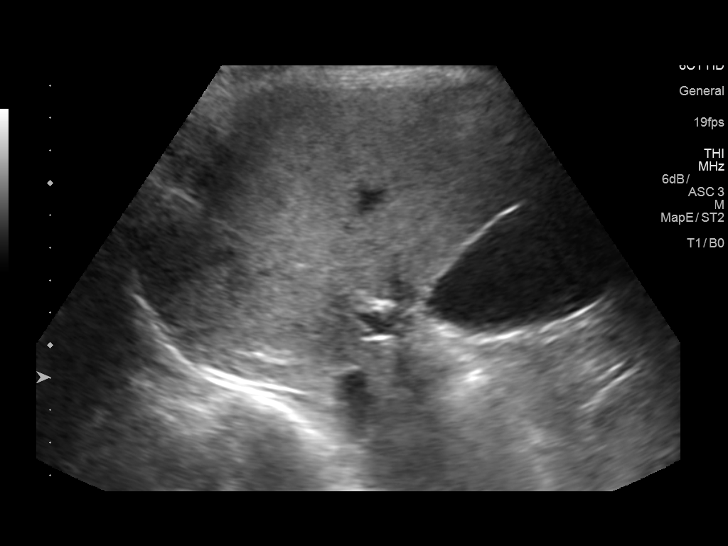

[14 of 25 positions shown; findings below may reference images not displayed]

FINDINGS: Gallbladder:

No gallstones or wall thickening visualized. No sonographic Murphy
sign noted by sonographer.

Common bile duct:

Diameter: 4.0 mm

Liver:

Increased echogenicity consistent fatty infiltration or
hepatocellular disease. No focal hepatic abnormality identified.
Portal vein is patent on color Doppler imaging with normal direction
of blood flow towards the liver.
IMPRESSION: 1. Increased hepatic echogenicity consistent fatty infiltration. No
focal hepatic abnormality identified.

2.  No gallstones or biliary distention.

## 2020-06-21 DIAGNOSIS — Z1231 Encounter for screening mammogram for malignant neoplasm of breast: Secondary | ICD-10-CM | POA: Diagnosis not present

## 2020-07-09 DIAGNOSIS — M810 Age-related osteoporosis without current pathological fracture: Secondary | ICD-10-CM | POA: Diagnosis not present

## 2020-07-09 DIAGNOSIS — N1831 Chronic kidney disease, stage 3a: Secondary | ICD-10-CM | POA: Diagnosis not present

## 2020-07-09 DIAGNOSIS — E049 Nontoxic goiter, unspecified: Secondary | ICD-10-CM | POA: Diagnosis not present

## 2020-07-22 DIAGNOSIS — M810 Age-related osteoporosis without current pathological fracture: Secondary | ICD-10-CM | POA: Diagnosis not present

## 2020-07-22 DIAGNOSIS — E049 Nontoxic goiter, unspecified: Secondary | ICD-10-CM | POA: Diagnosis not present

## 2020-07-22 DIAGNOSIS — K760 Fatty (change of) liver, not elsewhere classified: Secondary | ICD-10-CM | POA: Diagnosis not present

## 2020-07-22 DIAGNOSIS — Z0000 Encounter for general adult medical examination without abnormal findings: Secondary | ICD-10-CM | POA: Diagnosis not present

## 2020-07-22 DIAGNOSIS — R82998 Other abnormal findings in urine: Secondary | ICD-10-CM | POA: Diagnosis not present

## 2020-07-26 DIAGNOSIS — R1031 Right lower quadrant pain: Secondary | ICD-10-CM | POA: Diagnosis not present

## 2020-07-26 DIAGNOSIS — E049 Nontoxic goiter, unspecified: Secondary | ICD-10-CM | POA: Diagnosis not present

## 2020-08-24 ENCOUNTER — Encounter: Payer: Self-pay | Admitting: Gastroenterology

## 2020-08-24 ENCOUNTER — Ambulatory Visit: Payer: Medicare Other | Admitting: Gastroenterology

## 2020-08-24 VITALS — BP 132/70 | HR 75 | Ht 63.0 in | Wt 140.0 lb

## 2020-08-24 DIAGNOSIS — K529 Noninfective gastroenteritis and colitis, unspecified: Secondary | ICD-10-CM

## 2020-08-24 DIAGNOSIS — R1030 Lower abdominal pain, unspecified: Secondary | ICD-10-CM | POA: Diagnosis not present

## 2020-08-24 DIAGNOSIS — K760 Fatty (change of) liver, not elsewhere classified: Secondary | ICD-10-CM | POA: Diagnosis not present

## 2020-08-24 MED ORDER — DICYCLOMINE HCL 10 MG PO CAPS: 10.0000 mg | ORAL_CAPSULE | Freq: Three times a day (TID) | ORAL | 11 refills | Status: AC | PRN

## 2020-08-24 NOTE — Progress Notes (Signed)
History of Present Illness: This is a 69 year old female referred by [place], MD for the evaluation of lower abdominal pain, dyspepsia and frequent bowel movements.  She relates intermittent problems over the past couple years with episodic lower abdominal pain described as a "stomachache" often with cramping and frequently associated with multiple loose bowel movements which relieve the pain.  She cannot relate any particular food or activity however her symptoms often occur after meals.  Recent blood work performed Dr. Rinaldo Cloud office was unremarkable.  Imaging studies below.  She was placed on Nexium OTC by Dr. Evlyn Kanner and her symptoms have almost completely resolved.  She notes occasional mild lower abdominal pain associated with frequent bowel movements but she has not had any severe episodes of pain since beginning Nexium OTC.  She underwent colonoscopy in August 2015 which was normal. Denies weight loss, constipation,  change in stool caliber, melena, hematochezia, nausea, vomiting, dysphagia, reflux symptoms, chest pain.   CT AP 01/17/2019 No acute findings in the abdomen or pelvis. Specifically, no findings to explain the patient's history of right lower quadrant pain. The terminal ileum and appendix are normal. No right adnexal mass.  RUQ US IMPRESSION 08/06/2018: 1. Increased hepatic echogenicity consistent fatty infiltration. No focal hepatic abnormality identified. 2.  No gallstones or biliary distention    No Known Allergies Outpatient Medications Prior to Visit  Medication Sig Dispense Refill  . esomeprazole (NEXIUM) 20 MG capsule Take 20 mg by mouth daily at 12 noon.    . raloxifene (EVISTA) 60 MG tablet Take 60 mg by mouth daily.    Marland Kitchen VITAMIN D, CHOLECALCIFEROL, PO Take 1 tablet by mouth daily.     No facility-administered medications prior to visit.   Past Medical History:  Diagnosis Date  . Fatty liver   . Osteoporosis   . Retinal hemorrhage   . Stress fracture of  foot    Past Surgical History:  Procedure Laterality Date  . BREAST LUMPECTOMY     Social History   Socioeconomic History  . Marital status: Married    Spouse name: Not on file  . Number of children: Not on file  . Years of education: Not on file  . Highest education level: Not on file  Occupational History  . Not on file  Tobacco Use  . Smoking status: Never Smoker  . Smokeless tobacco: Never Used  Substance and Sexual Activity  . Alcohol use: Yes    Comment: ocassional; "maybe 2 glasses wine per month" per pt./RM  . Drug use: No  . Sexual activity: Not on file  Other Topics Concern  . Not on file  Social History Narrative  . Not on file   Social Determinants of Health   Financial Resource Strain:   . Difficulty of Paying Living Expenses: Not on file  Food Insecurity:   . Worried About Programme researcher, broadcasting/film/video in the Last Year: Not on file  . Ran Out of Food in the Last Year: Not on file  Transportation Needs:   . Lack of Transportation (Medical): Not on file  . Lack of Transportation (Non-Medical): Not on file  Physical Activity:   . Days of Exercise per Week: Not on file  . Minutes of Exercise per Session: Not on file  Stress:   . Feeling of Stress : Not on file  Social Connections:   . Frequency of Communication with Friends and Family: Not on file  . Frequency of Social Gatherings with Friends  and Family: Not on file  . Attends Religious Services: Not on file  . Active Member of Clubs or Organizations: Not on file  . Attends Banker Meetings: Not on file  . Marital Status: Not on file   Family History  Problem Relation Age of Onset  . Prostate cancer Father   . Colon cancer Neg Hx       Review of Systems: Pertinent positive and negative review of systems were noted in the above HPI section. All other review of systems were otherwise negative.   Physical Exam: General: Well developed, well nourished, no acute distress Head: Normocephalic and  atraumatic Eyes:  sclerae anicteric, EOMI Ears: Normal auditory acuity Mouth: Not examined, mask on during Covid-19 pandemic Neck: Supple, no masses or thyromegaly Lungs: Clear throughout to auscultation Heart: Regular rate and rhythm; no murmurs, rubs or bruits Abdomen: Soft, non tender and non distended. No masses, hepatosplenomegaly or hernias noted. Normal Bowel sounds Rectal: Not done Musculoskeletal: Symmetrical with no gross deformities  Skin: No lesions on visible extremities Pulses:  Normal pulses noted Extremities: No clubbing, cyanosis, edema or deformities noted Neurological: Alert oriented x 4, grossly nonfocal Cervical Nodes:  No significant cervical adenopathy Inguinal Nodes: No significant inguinal adenopathy Psychological:  Alert and cooperative. Normal mood and affect   Assessment and Recommendations:  1.  Episodic lower abdominal pain often following meals and associated with frequent bowel movements.  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 Possible GERD, gastritis or duodenitis contributing. Symptoms have substantially improved on Nexium OTC qd.  Add dicyclomine 10 mg p.o. 3 times daily taken before meals as needed.  She notes symptoms occur more often following her evening meal so she may use dicyclomine before her evening meal on a regular basis as needed.  If symptoms are well controlled she can discontinue Nexium in about 1 month and if symptoms return resume Nexium daily.  She is advised to follow-up with me for consideration of colonoscopy and EGD if symptoms are not completely controlled. If symptoms are controlled follow-up with Dr. Evlyn Kanner.  2. Hepatic steatosis. Recent LFTs were normal.  Long-term fat modified, carb modified diet.  Trend LFTs at regular office appointments with Dr. Evlyn Kanner.  3. CRC screening, average risk.  A 10-year interval colonoscopy is recommended in August 2025.   cc: Adrian Prince, MD [address] [place],  [place] [address]

## 2020-08-24 NOTE — Patient Instructions (Signed)
We have sent the following medications to your pharmacy for you to pick up at your convenience: dicyclomine.   Stay on Nexium daily x 1 month, then reduce to every other day x 2 weeks, then every 3rd day x 2 weeks then stop.  Thank you for choosing me and [facility].  Venita Lick. Pleas Koch., MD., Clementeen Graham

## 2020-09-04 ENCOUNTER — Ambulatory Visit: Payer: Medicare Other | Attending: Internal Medicine

## 2020-09-04 DIAGNOSIS — Z23 Encounter for immunization: Secondary | ICD-10-CM

## 2020-09-04 NOTE — Progress Notes (Signed)
   Covid-19 Vaccination Clinic  Name:  Victoria Carlson    MRN: [id number] DOB: [birth year]  09/04/2020  Victoria Carlson was observed post Covid-19 immunization for 15 minutes without incident. She was provided with Vaccine Information Sheet and instruction to access the V-Safe system.   Victoria Carlson was instructed to call 911 with any severe reactions post vaccine: Marland Kitchen Difficulty breathing  . Swelling of face and throat  . A fast heartbeat  . A bad rash all over body  . Dizziness and weakness

## 2020-09-06 DIAGNOSIS — H35351 Cystoid macular degeneration, right eye: Secondary | ICD-10-CM | POA: Diagnosis not present

## 2020-09-22 IMAGING — CT CT ABDOMEN AND PELVIS WITH CONTRAST
1 of 3 series · 13 of 32 positions shown, 18 images · IV contrast (APPLIED)
Comparison: None.

CLINICAL DATA: Nagging right lower quadrant pain since [REDACTED] last
year.

EXAM:
CT ABDOMEN AND PELVIS WITH CONTRAST
TECHNIQUE: Multidetector CT imaging of the abdomen and pelvis was performed
using the standard protocol following bolus administration of
intravenous contrast.
CONTRAST:  100mL G2DRY4-0WW IOPAMIDOL (G2DRY4-0WW) INJECTION 61%

[Series 2: abd/pelvis w/cm · axial · 0.71mm/px · z∈[-386,+8]mm · 13 of 91 slices shown, 18 images]
[im 6/91  soft-tissue]
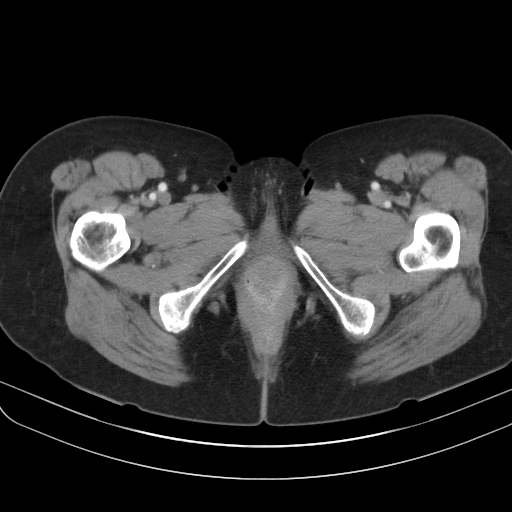
[im 6/91  bone]
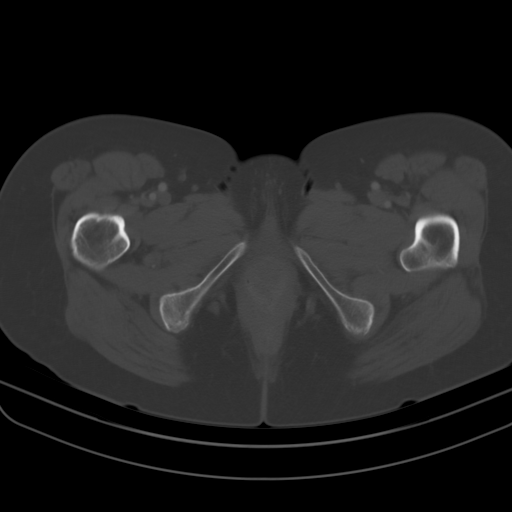
[im 16/91  soft-tissue]
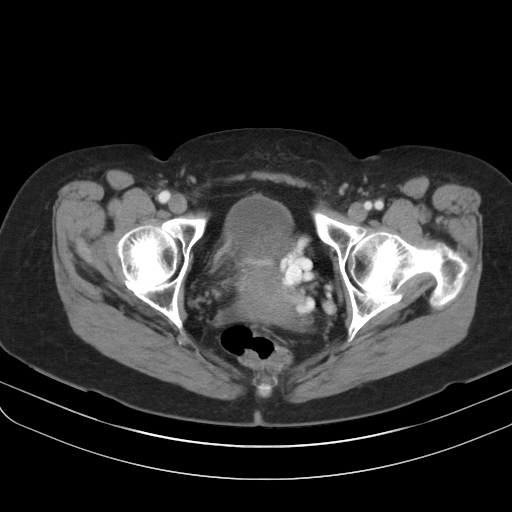
[im 22/91  soft-tissue]
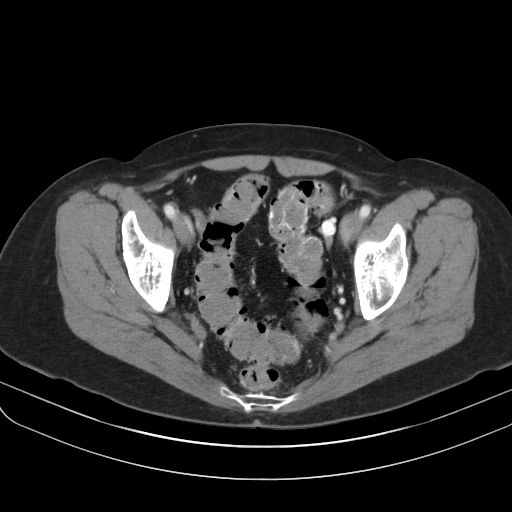
[im 27/91  soft-tissue]
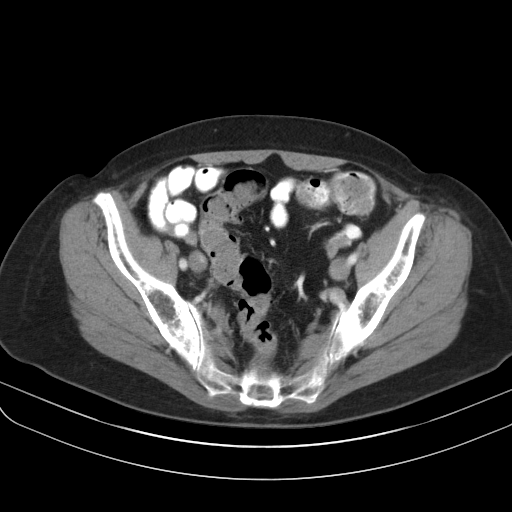
[im 38/91  soft-tissue]
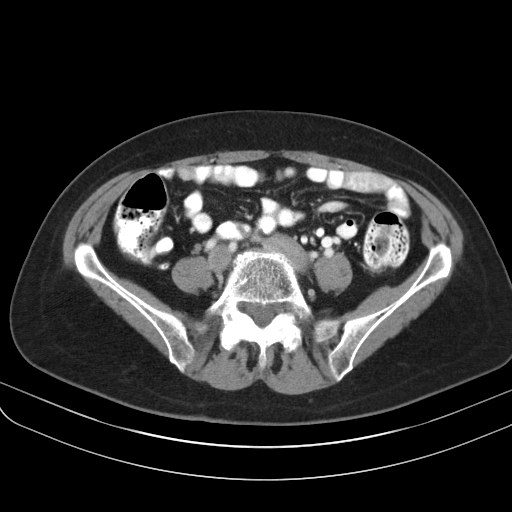
[im 43/91  soft-tissue]
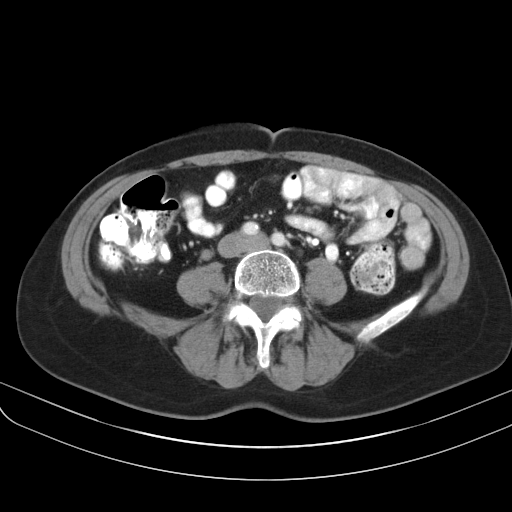
[im 48/91  soft-tissue]
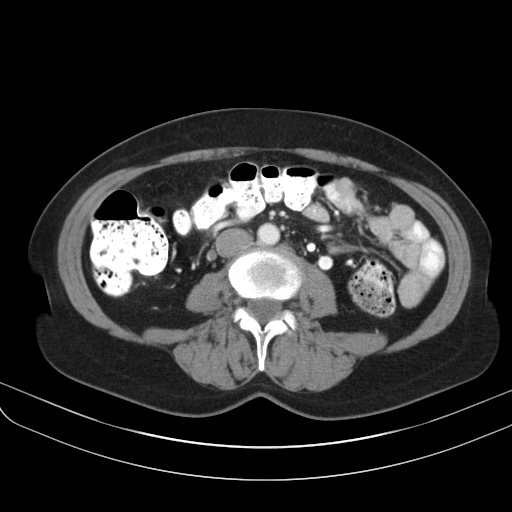
[im 59/91  soft-tissue]
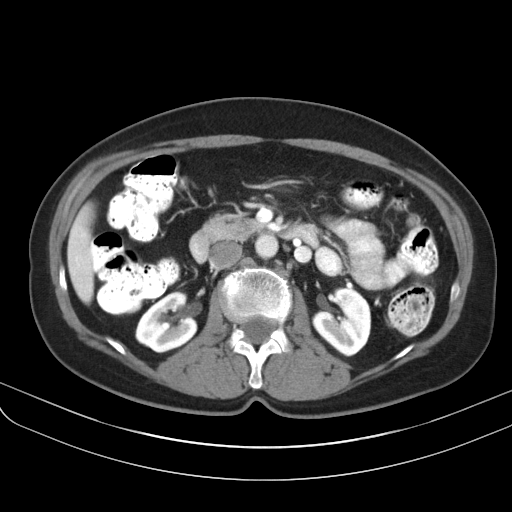
[im 64/91  soft-tissue]
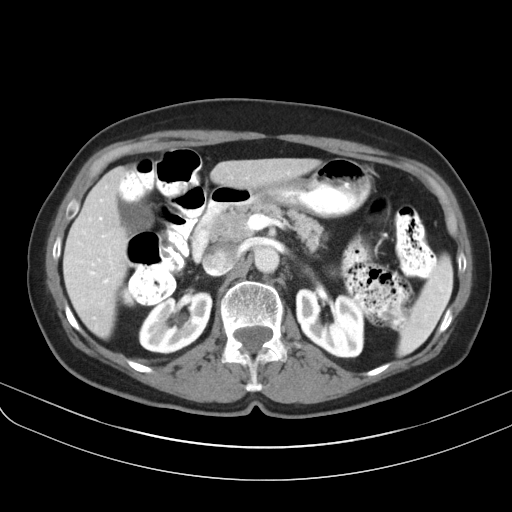
[im 64/91  bone]
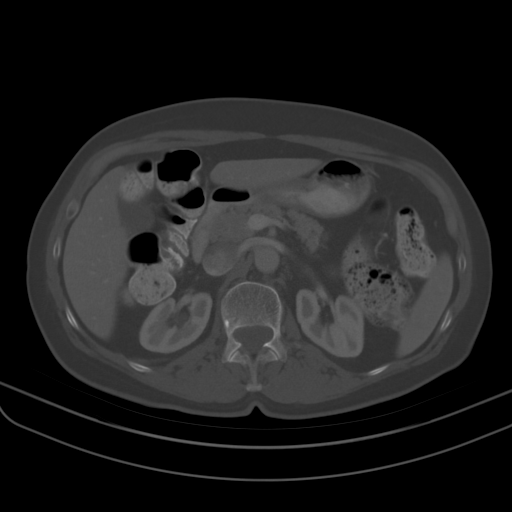
[im 69/91  soft-tissue]
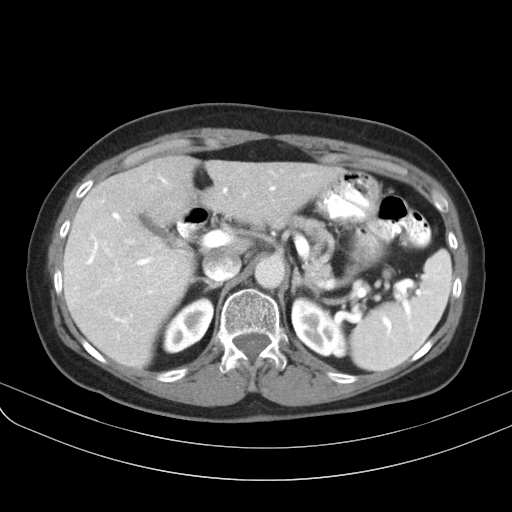
[im 69/91  lung]
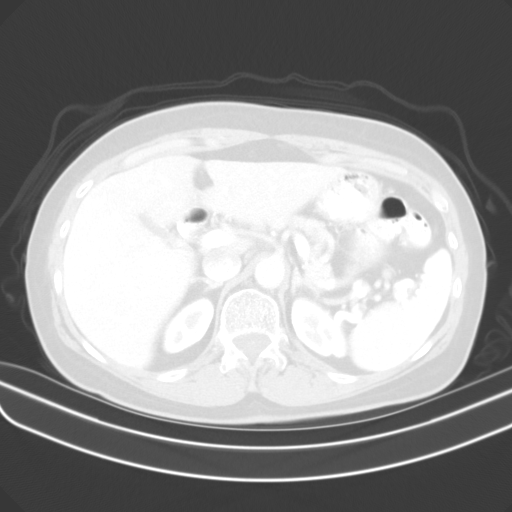
[im 75/91  lung]
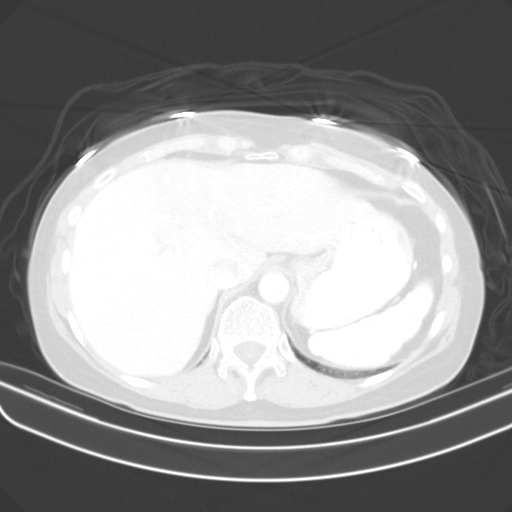
[im 80/91  soft-tissue]
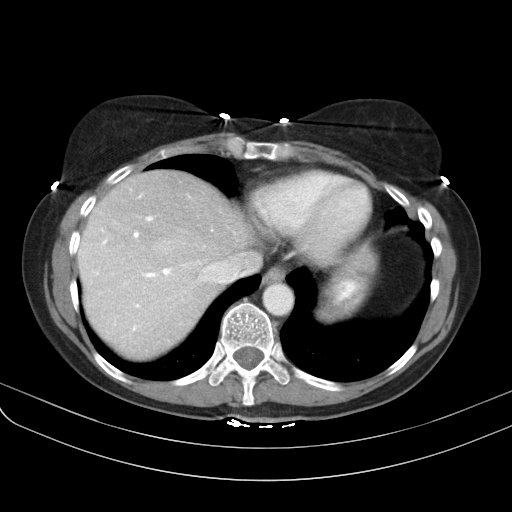
[im 80/91  lung]
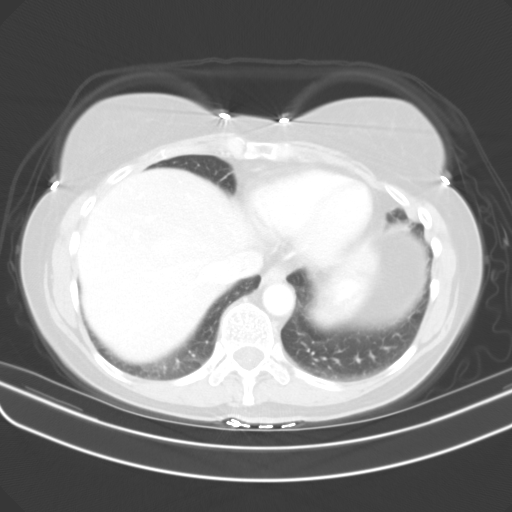
[im 85/91  soft-tissue]
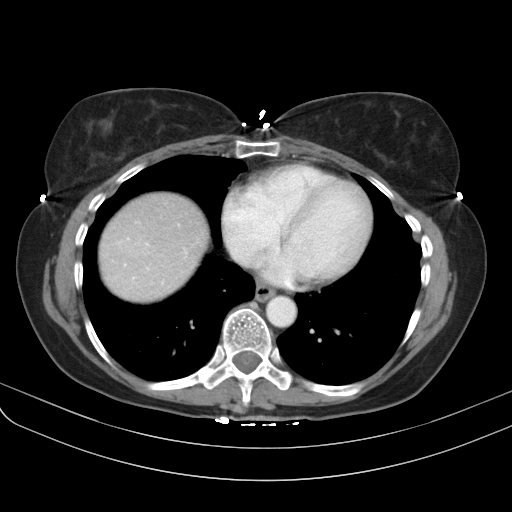
[im 85/91  lung]
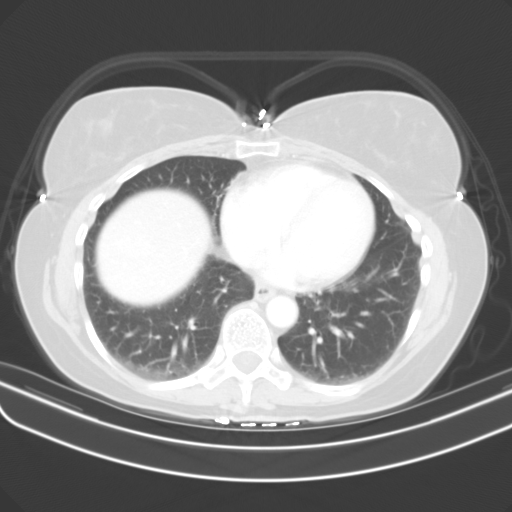

[13 of 32 positions shown; findings below may reference images not displayed]

FINDINGS: Creatinine was obtained on site at [HOSPITAL] at [HOSPITAL].

Results: Creatinine 1.1 mg/dL.

Lower chest: Unremarkable

Hepatobiliary: No suspicious focal abnormality within the liver
parenchyma. There is no evidence for gallstones, gallbladder wall
thickening, or pericholecystic fluid. No intrahepatic or
extrahepatic biliary dilation.

Pancreas: No focal mass lesion. No dilatation of the main duct. No
intraparenchymal cyst. No peripancreatic edema.

Spleen: No splenomegaly. No focal mass lesion.

Adrenals/Urinary Tract: No adrenal nodule or mass. Kidneys
unremarkable. No evidence for hydroureter. The urinary bladder
appears normal for the degree of distention.

Stomach/Bowel: Stomach is unremarkable. No gastric wall thickening.
No evidence of outlet obstruction. Duodenum is normally positioned
as is the ligament of Treitz. No small bowel wall thickening. No
small bowel dilatation. The terminal ileum is normal. The appendix
is normal. No gross colonic mass. No colonic wall thickening.

Vascular/Lymphatic: No abdominal aortic aneurysm. No abdominal
aortic atherosclerotic calcification. There is no gastrohepatic or
hepatoduodenal ligament lymphadenopathy. No intraperitoneal or
retroperitoneal lymphadenopathy. No pelvic sidewall lymphadenopathy.

Reproductive: The uterus is unremarkable.  There is no adnexal mass.

Other: No intraperitoneal free fluid.

Musculoskeletal: No worrisome lytic or sclerotic osseous
abnormality.
IMPRESSION: No acute findings in the abdomen or pelvis. Specifically, no
findings to explain the patient's history of right lower quadrant
pain. The terminal ileum and appendix are normal. No right adnexal
mass.

## 2020-10-08 DIAGNOSIS — Z1212 Encounter for screening for malignant neoplasm of rectum: Secondary | ICD-10-CM | POA: Diagnosis not present

## 2021-06-29 DIAGNOSIS — Z1231 Encounter for screening mammogram for malignant neoplasm of breast: Secondary | ICD-10-CM | POA: Diagnosis not present

## 2021-07-21 DIAGNOSIS — E049 Nontoxic goiter, unspecified: Secondary | ICD-10-CM | POA: Diagnosis not present

## 2021-07-21 DIAGNOSIS — M810 Age-related osteoporosis without current pathological fracture: Secondary | ICD-10-CM | POA: Diagnosis not present

## 2021-07-21 DIAGNOSIS — N1831 Chronic kidney disease, stage 3a: Secondary | ICD-10-CM | POA: Diagnosis not present

## 2021-07-21 DIAGNOSIS — K760 Fatty (change of) liver, not elsewhere classified: Secondary | ICD-10-CM | POA: Diagnosis not present

## 2021-08-04 DIAGNOSIS — M7702 Medial epicondylitis, left elbow: Secondary | ICD-10-CM | POA: Diagnosis not present

## 2021-11-02 ENCOUNTER — Encounter (HOSPITAL_BASED_OUTPATIENT_CLINIC_OR_DEPARTMENT_OTHER): Payer: Self-pay

## 2021-11-02 ENCOUNTER — Other Ambulatory Visit: Payer: Self-pay

## 2021-11-02 DIAGNOSIS — R519 Headache, unspecified: Secondary | ICD-10-CM | POA: Diagnosis present

## 2021-11-02 DIAGNOSIS — U071 COVID-19: Secondary | ICD-10-CM | POA: Diagnosis not present

## 2021-11-02 LAB — RESP PANEL BY RT-PCR (FLU A&B, COVID) ARPGX2
Influenza A by PCR: NEGATIVE
Influenza B by PCR: NEGATIVE
SARS Coronavirus 2 by RT PCR: POSITIVE — AB

## 2021-11-02 NOTE — ED Triage Notes (Signed)
Pt presents to the ED with chills, headaches, body aches, and fatigue. Pt states that these symptoms started today and that her husband tested positive for covid. Pt A&Ox4 at time of triage. VSS

## 2021-11-03 ENCOUNTER — Emergency Department (HOSPITAL_BASED_OUTPATIENT_CLINIC_OR_DEPARTMENT_OTHER)
Admission: EM | Admit: 2021-11-03 | Discharge: 2021-11-03 | Disposition: A | Discharge status: Home / Self Care | Payer: Medicare Other | Attending: Emergency Medicine | Admitting: Emergency Medicine

## 2021-11-03 DIAGNOSIS — U071 COVID-19: Secondary | ICD-10-CM

## 2021-11-03 MED ORDER — NIRMATRELVIR/RITONAVIR (PAXLOVID)TABLET: 3.0000 | ORAL_TABLET | Freq: Two times a day (BID) | ORAL | 0 refills | Status: AC

## 2021-11-03 NOTE — ED Provider Notes (Signed)
[facility] EMERGENCY DEPT Provider Note   CSN: [id number] Arrival date & time: 11/02/21  2245     History Chief Complaint  Patient presents with   Headache   Chills    Victoria Carlson is a 70 y.o. female.  The history is provided by the patient.  Headache She has history of fatty liver and comes in with fever and cough which started today.  She did not take her temperature at home.  She has had some chills and sweats.  There has been some clear rhinorrhea.  Cough is nonproductive.  She is also complaining of some generalized body aches.  She denies sore throat, vomiting, diarrhea.  She has been exposed to someone with COVID-19.  She has completed COVID-19 vaccinations including a booster.   Past Medical History:  Diagnosis Date   Fatty liver    Osteoporosis    Retinal hemorrhage    Stress fracture of foot     There are no problems to display for this patient.   Past Surgical History:  Procedure Laterality Date   BREAST LUMPECTOMY       OB History   No obstetric history on file.     Family History  Problem Relation Age of Onset   Prostate cancer Father    Colon cancer Neg Hx     Social History   Tobacco Use   Smoking status: Never   Smokeless tobacco: Never  Substance Use Topics   Alcohol use: Yes    Comment: ocassional; "maybe 2 glasses wine per month" per pt./RM   Drug use: No    Home Medications Prior to Admission medications   Medication Sig Start Date End Date Taking? Authorizing Provider  dicyclomine (BENTYL) 10 MG capsule Take 1 capsule (10 mg total) by mouth 3 (three) times daily with meals as needed for spasms. 08/24/20   Meryl Dare, MD  esomeprazole (NEXIUM) 20 MG capsule Take 20 mg by mouth daily at 12 noon.    [provider]  raloxifene (EVISTA) 60 MG tablet Take 60 mg by mouth daily.    [provider]  VITAMIN D, CHOLECALCIFEROL, PO Take 1 tablet by mouth daily.    [provider]     Allergies    Patient has no known allergies.  Review of Systems   Review of Systems  Neurological:  Positive for headaches.  All other systems reviewed and are negative.  Physical Exam Updated Vital Signs BP 103/83    Pulse 84    Temp 99.6 F (37.6 C)    Resp 16    Ht 5\' 3"  (1.6 m)    Wt 62.6 kg    SpO2 97%    BMI 24.45 kg/m   Physical Exam Vitals and nursing note reviewed.  70 year old female, resting comfortably and in no acute distress. Vital signs are normal. Oxygen saturation is 97%, which is normal. Head is normocephalic and atraumatic. PERRLA, EOMI. Oropharynx is clear. Neck is nontender and supple without adenopathy or JVD. Back is nontender and there is no CVA tenderness. Lungs are clear without rales, wheezes, or rhonchi. Chest is nontender. Heart has regular rate and rhythm without murmur. Abdomen is soft, flat, nontender. Extremities have no cyanosis or edema, full range of motion is present. Skin is warm and dry without rash. Neurologic: Mental status is normal, cranial nerves are intact, moves all extremities equally.  ED Results / Procedures / Treatments   Labs (all labs ordered are listed, but  only abnormal results are displayed) Labs Reviewed  RESP PANEL BY RT-PCR (FLU A&B, COVID) ARPGX2 - Abnormal; Notable for the following components:      Result Value   SARS Coronavirus 2 by RT PCR POSITIVE (*)    All other components within normal limits   Procedures Procedures   Medications Ordered in ED Medications - No data to display  ED Course  I have reviewed the triage vital signs and the nursing notes.  Pertinent lab results that were available during my care of the patient were reviewed by me and considered in my medical decision making (see chart for details).   MDM Rules/Calculators/A&P                         Viral infection consistent with COVID-19.  Respiratory pathogen panel obtained is positive for COVID-19.  She does have risk factor of  advanced age, so was offered prescription for Paxlovid.  Old records are reviewed, and she has no relevant past visits.  Recommended symptomatic treatment at home, return precautions discussed.  Final Clinical Impression(s) / ED Diagnoses Final diagnoses:  COVID-19 virus infection    Rx / DC Orders ED Discharge Orders          Ordered    nirmatrelvir/ritonavir EUA (PAXLOVID) 20 x 150 MG & 10 x 100MG  TABS  2 times daily        11/03/21 0503             11/05/21, MD 11/03/21 [phone]

## 2022-07-05 DIAGNOSIS — Z1231 Encounter for screening mammogram for malignant neoplasm of breast: Secondary | ICD-10-CM | POA: Diagnosis not present

## 2022-07-17 DIAGNOSIS — E049 Nontoxic goiter, unspecified: Secondary | ICD-10-CM | POA: Diagnosis not present

## 2022-07-17 DIAGNOSIS — R7989 Other specified abnormal findings of blood chemistry: Secondary | ICD-10-CM | POA: Diagnosis not present

## 2022-07-17 DIAGNOSIS — N1831 Chronic kidney disease, stage 3a: Secondary | ICD-10-CM | POA: Diagnosis not present

## 2022-07-17 DIAGNOSIS — M810 Age-related osteoporosis without current pathological fracture: Secondary | ICD-10-CM | POA: Diagnosis not present

## 2022-07-21 DIAGNOSIS — R82998 Other abnormal findings in urine: Secondary | ICD-10-CM | POA: Diagnosis not present

## 2022-10-19 DIAGNOSIS — K760 Fatty (change of) liver, not elsewhere classified: Secondary | ICD-10-CM | POA: Diagnosis not present

## 2022-10-19 DIAGNOSIS — Z0000 Encounter for general adult medical examination without abnormal findings: Secondary | ICD-10-CM | POA: Diagnosis not present

## 2022-10-19 DIAGNOSIS — N1831 Chronic kidney disease, stage 3a: Secondary | ICD-10-CM | POA: Diagnosis not present

## 2022-10-19 DIAGNOSIS — E049 Nontoxic goiter, unspecified: Secondary | ICD-10-CM | POA: Diagnosis not present

## 2022-12-03 ENCOUNTER — Emergency Department (HOSPITAL_BASED_OUTPATIENT_CLINIC_OR_DEPARTMENT_OTHER)
Admission: EM | Admit: 2022-12-03 | Discharge: 2022-12-03 | Disposition: A | Discharge status: Home / Self Care | Payer: Medicare HMO | Attending: Emergency Medicine | Admitting: Emergency Medicine

## 2022-12-03 ENCOUNTER — Other Ambulatory Visit: Payer: Self-pay

## 2022-12-03 ENCOUNTER — Emergency Department (HOSPITAL_BASED_OUTPATIENT_CLINIC_OR_DEPARTMENT_OTHER): Payer: Medicare HMO | Admitting: Radiology

## 2022-12-03 DIAGNOSIS — Z20822 Contact with and (suspected) exposure to covid-19: Secondary | ICD-10-CM | POA: Diagnosis not present

## 2022-12-03 DIAGNOSIS — J988 Other specified respiratory disorders: Secondary | ICD-10-CM | POA: Diagnosis not present

## 2022-12-03 DIAGNOSIS — B974 Respiratory syncytial virus as the cause of diseases classified elsewhere: Secondary | ICD-10-CM | POA: Diagnosis not present

## 2022-12-03 DIAGNOSIS — B338 Other specified viral diseases: Secondary | ICD-10-CM

## 2022-12-03 DIAGNOSIS — R059 Cough, unspecified: Secondary | ICD-10-CM | POA: Diagnosis present

## 2022-12-03 DIAGNOSIS — R0602 Shortness of breath: Secondary | ICD-10-CM | POA: Diagnosis not present

## 2022-12-03 DIAGNOSIS — R0789 Other chest pain: Secondary | ICD-10-CM | POA: Diagnosis not present

## 2022-12-03 LAB — CBC
HCT: 37.2 % (ref 36.0–46.0)
Hemoglobin: 12.5 g/dL (ref 12.0–15.0)
MCH: 30.9 pg (ref 26.0–34.0)
MCHC: 33.6 g/dL (ref 30.0–36.0)
MCV: 92.1 fL (ref 80.0–100.0)
Platelets: 192 10*3/uL (ref 150–400)
RBC: 4.04 MIL/uL (ref 3.87–5.11)
RDW: 13 % (ref 11.5–15.5)
WBC: 5.4 10*3/uL (ref 4.0–10.5)
nRBC: 0 % (ref 0.0–0.2)

## 2022-12-03 LAB — BASIC METABOLIC PANEL
Anion gap: 9 (ref 5–15)
BUN: 23 mg/dL (ref 8–23)
CO2: 25 mmol/L (ref 22–32)
Calcium: 9 mg/dL (ref 8.9–10.3)
Chloride: 104 mmol/L (ref 98–111)
Creatinine, Ser: 0.96 mg/dL (ref 0.44–1.00)
GFR, Estimated: >60 mL/min (ref >60)
Glucose, Bld: 98 mg/dL (ref 70–99)
Potassium: 4.5 mmol/L (ref 3.5–5.1)
Sodium: 138 mmol/L (ref 135–145)

## 2022-12-03 LAB — RESP PANEL BY RT-PCR (RSV, FLU A&B, COVID)  RVPGX2
Influenza A by PCR: NEGATIVE
Influenza B by PCR: NEGATIVE
Resp Syncytial Virus by PCR: POSITIVE — AB
SARS Coronavirus 2 by RT PCR: NEGATIVE

## 2022-12-03 LAB — TROPONIN I (HIGH SENSITIVITY): Troponin I (High Sensitivity): 2 ng/L (ref <18)

## 2022-12-03 MED ORDER — BENZONATATE 100 MG PO CAPS: 100.0000 mg | ORAL_CAPSULE | Freq: Three times a day (TID) | ORAL | 0 refills | Status: AC

## 2022-12-03 MED ORDER — GUAIFENESIN ER 1200 MG PO TB12: 1.0000 | ORAL_TABLET | Freq: Two times a day (BID) | ORAL | 0 refills | Status: DC

## 2022-12-03 MED ORDER — GUAIFENESIN ER 1200 MG PO TB12: 1.0000 | ORAL_TABLET | Freq: Two times a day (BID) | ORAL | 0 refills | Status: AC

## 2022-12-03 MED ORDER — ALBUTEROL SULFATE HFA 108 (90 BASE) MCG/ACT IN AERS: 2.0000 | INHALATION_SPRAY | RESPIRATORY_TRACT | Status: DC | PRN

## 2022-12-03 MED ORDER — BENZONATATE 100 MG PO CAPS: 100.0000 mg | ORAL_CAPSULE | Freq: Three times a day (TID) | ORAL | 0 refills | Status: DC

## 2022-12-03 NOTE — ED Triage Notes (Signed)
Patient arrives with complaints of chest discomfort related to worsening cough. Also reports having some diarrhea. Rates chest discomfort a 6/10.   Patient reports a sick contact in her home.

## 2022-12-03 NOTE — Discharge Instructions (Signed)
Take the medications as prescribed to help with your cough and congestion.  The RSV infection should eventually resolve on its own.  Follow-up with your doctor if not improving in the week.  Return to the ED for shortness of breath or other concerning symptoms

## 2022-12-03 NOTE — ED Provider Notes (Signed)
[place] EMERGENCY DEPARTMENT AT [facility] Provider Note   CSN: [id number] Arrival date & time: 12/03/22  1428     History  Chief Complaint  Patient presents with   Cough   Chest Pain    Victoria Carlson is a 72 y.o. female.   Cough Associated symptoms: chest pain   Chest Pain Associated symptoms: cough      Pt states she has been coughing and congested the last few days.  She felt feverish the other day.  She has been feeling short of breath.  The pain in the chest started today.  It feels like a ton of breaks.  It hurts when lying down.  It also hurts more with coughing.  Home Medications Prior to Admission medications   Medication Sig Start Date End Date Taking? Authorizing Provider  benzonatate (TESSALON) 100 MG capsule Take 1 capsule (100 mg total) by mouth every 8 (eight) hours. 12/03/22  Yes Linwood Dibbles, MD  Guaifenesin 1200 MG TB12 Take 1 tablet (1,200 mg total) by mouth 2 (two) times daily at 10 AM and 5 PM. 12/03/22  Yes Linwood Dibbles, MD  dicyclomine (BENTYL) 10 MG capsule Take 1 capsule (10 mg total) by mouth 3 (three) times daily with meals as needed for spasms. 08/24/20   Meryl Dare, MD  esomeprazole (NEXIUM) 20 MG capsule Take 20 mg by mouth daily at 12 noon.    [provider]  raloxifene (EVISTA) 60 MG tablet Take 60 mg by mouth daily.    [provider]  VITAMIN D, CHOLECALCIFEROL, PO Take 1 tablet by mouth daily.    [provider]      Allergies    Patient has no known allergies.    Review of Systems   Review of Systems  Respiratory:  Positive for cough.   Cardiovascular:  Positive for chest pain.    Physical Exam Updated Vital Signs BP 128/82 (BP Location: Right Arm)   Pulse 66   Temp 97.9 F (36.6 C) (Oral)   Resp 18   Ht 1.6 m (5\' 3" )   Wt 61.2 kg   SpO2 98%   BMI 23.91 kg/m  Physical Exam Vitals and nursing note reviewed.  Constitutional:      General: She is not in acute distress.     Appearance: She is well-developed.  HENT:     Head: Normocephalic and atraumatic.     Right Ear: External ear normal.     Left Ear: External ear normal.  Eyes:     General: No scleral icterus.       Right eye: No discharge.        Left eye: No discharge.     Conjunctiva/sclera: Conjunctivae normal.  Neck:     Trachea: No tracheal deviation.  Cardiovascular:     Rate and Rhythm: Normal rate and regular rhythm.  Pulmonary:     Effort: Pulmonary effort is normal. No respiratory distress.     Breath sounds: Normal breath sounds. No stridor. No wheezing or rales.  Abdominal:     General: Bowel sounds are normal. There is no distension.     Palpations: Abdomen is soft.     Tenderness: There is no abdominal tenderness. There is no guarding or rebound.  Musculoskeletal:        General: No tenderness or deformity.     Cervical back: Neck supple.  Skin:    General: Skin is warm and dry.     Findings: No  rash.  Neurological:     General: No focal deficit present.     Mental Status: She is alert.     Cranial Nerves: No cranial nerve deficit, dysarthria or facial asymmetry.     Sensory: No sensory deficit.     Motor: No abnormal muscle tone or seizure activity.     Coordination: Coordination normal.  Psychiatric:        Mood and Affect: Mood normal.     ED Results / Procedures / Treatments   Labs (all labs ordered are listed, but only abnormal results are displayed) Labs Reviewed  RESP PANEL BY RT-PCR (RSV, FLU A&B, COVID)  RVPGX2 - Abnormal; Notable for the following components:      Result Value   Resp Syncytial Virus by PCR POSITIVE (*)    All other components within normal limits  CBC  BASIC METABOLIC PANEL  TROPONIN I (HIGH SENSITIVITY)    EKG EKG Interpretation  Date/Time:  Sunday December 03 2022 14:41:42 EST Ventricular Rate:  76 PR Interval:  134 QRS Duration: 88 QT Interval:  390 QTC Calculation: 438 R Axis:   67 Text Interpretation: Sinus rhythm with  occasional Premature ventricular complexes Nonspecific ST abnormality Abnormal ECG No previous ECGs available Confirmed by Dorie Rank [phone]) on 12/03/2022 4:12:06 PM  Radiology DG Chest 2 View  Result Date: 12/03/2022 CLINICAL DATA:  Shortness of breath EXAM: CHEST - 2 VIEW COMPARISON:  None Available. FINDINGS: The heart size and mediastinal contours are within normal limits. No focal airspace consolidation, pleural effusion, or pneumothorax. 4 cm area of sclerosis within ring and arc mineralization in the proximal right humerus suggestive of an enchondroma. No acute bony abnormality. IMPRESSION: 1. No active cardiopulmonary disease. 2. Incidentally noted 4 cm area of sclerosis within the proximal right humerus suggestive of an enchondroma. Electronically Signed   By: Davina Poke D.O.   On: 12/03/2022 16:31    Procedures Procedures    Medications Ordered in ED Medications  albuterol (VENTOLIN HFA) 108 (90 Base) MCG/ACT inhaler 2 puff (has no administration in time range)    ED Course/ Medical Decision Making/ A&P Clinical Course as of 12/03/22 1756  Sun Dec 03, 2022  1641 Chest x-ray without acute pneumonia.  RSV positive [JK]  1750 Troponin I (High Sensitivity) Troponin normal.  CBC metabolic panel normal. [JK]    Clinical Course User Index [JK] Dorie Rank, MD                             Medical Decision Making Problems Addressed: RSV infection: acute illness or injury that poses a threat to life or bodily functions  Amount and/or Complexity of Data Reviewed Labs: ordered. Decision-making details documented in ED Course. Radiology: ordered and independent interpretation performed.  Risk OTC drugs. Prescription drug management.   Patient presented to the ED with complaints of chest pain with recent respiratory symptoms of cough and congestion.  Patient is RSV positive.  Fortunately no pneumonia or oxygen requirement.  She is not having any breathing difficulty.  Stable  for supportive care.  With her complaint of severe chest pressure cardiac enzymes and EKG were ordered.  Laboratory tests are reassuring.  Doubt cardiac etiology.  Suspect her chest discomfort is related to her RSV infection.        Final Clinical Impression(s) / ED Diagnoses Final diagnoses:  RSV infection    Rx / DC Orders ED Discharge Orders  Ordered    benzonatate (TESSALON) 100 MG capsule  Every 8 hours        12/03/22 1640    Guaifenesin 1200 MG TB12  2 times daily        12/03/22 1640              Dorie Rank, MD 12/03/22 1757

## 2022-12-03 NOTE — ED Notes (Signed)
Patient did not need or want an MDI at this time. Patient appears to be in no distress at this time. RT will continue to monitor while patient is here at [facility]

## 2022-12-07 ENCOUNTER — Telehealth: Payer: Self-pay

## 2022-12-07 NOTE — Telephone Encounter (Signed)
        Patient  visited [facility] on 12/07/2022  for RSV infection.   Telephone encounter attempt :  1st  Unable to leave message no answer.   [place] Resource Care Guide   [email or web address]  ?? [phone]   Website: [email or web address]  [email or web address]

## 2022-12-11 ENCOUNTER — Telehealth: Payer: Self-pay

## 2022-12-11 NOTE — Telephone Encounter (Signed)
        Patient  visited [facility] on 12/03/2022  for RSV infection..   Telephone encounter attempt :  2nd  A HIPAA compliant voice message was left requesting a return call.  Instructed patient to call back at [phone].   [place] Resource Care Guide   [email or web address]  ?? [phone]   Website: [email or web address]  [email or web address]

## 2022-12-14 ENCOUNTER — Telehealth: Payer: Self-pay

## 2022-12-14 NOTE — Telephone Encounter (Signed)
        Patient  visited [facility] on 12/03/2022  for RSV infection.   Telephone encounter attempt : 3rd   Unable to leave message busy no answer.   [place] Resource Care Guide   [email or web address]  ?? [phone]   Website: [email or web address]  [email or web address]

## 2023-04-04 DIAGNOSIS — M25572 Pain in left ankle and joints of left foot: Secondary | ICD-10-CM | POA: Diagnosis not present

## 2023-04-05 ENCOUNTER — Ambulatory Visit (HOSPITAL_COMMUNITY)
Admission: RE | Admit: 2023-04-05 | Discharge: 2023-04-05 | Disposition: A | Discharge status: Home / Self Care | Payer: Medicare HMO | Source: Ambulatory Visit | Attending: Orthopaedic Surgery | Admitting: Orthopaedic Surgery

## 2023-04-05 ENCOUNTER — Ambulatory Visit: Payer: Medicare HMO | Admitting: Orthopaedic Surgery

## 2023-04-05 ENCOUNTER — Ambulatory Visit (INDEPENDENT_AMBULATORY_CARE_PROVIDER_SITE_OTHER): Payer: Medicare HMO

## 2023-04-05 DIAGNOSIS — M79672 Pain in left foot: Secondary | ICD-10-CM | POA: Insufficient documentation

## 2023-04-05 NOTE — Progress Notes (Signed)
Office Visit Note   Patient: Victoria Carlson           Date of Birth: 08-02-1951           MRN: [id number] Visit Date: 04/05/2023              Requested by: Adrian Prince, MD [address] [place],  [place] [address] PCP: Adrian Prince, MD   Assessment & Plan: Visit Diagnoses:  1. Pain in left foot     Plan: Impression is left foot pain concerning for underlying stress reaction not seen on plain imaging today.  We discussed putting her in a short cam boot weightbearing as tolerated for the next 2 to 4 weeks.  She will elevate for swelling.  Will also go ahead and get a venous Doppler ultrasound left lower extremity to rule out DVT as her symptoms started after a car ride to the beach.  Follow-up with [place] in 2 to 4 weeks for recheck.  If her symptoms have not improved, we will order an MRI of the left foot to assess for stress fracture.  Call with concerns or questions in the meantime.  Follow-Up Instructions: Return in about 3 weeks (around 04/26/2023).   Orders:  Orders Placed This Encounter  Procedures   XR Foot Complete Left   VAS [place] LOWER EXTREMITY VENOUS (DVT)   No orders of the defined types were placed in this encounter.     Procedures: No procedures performed   Clinical Data: No additional findings.   Subjective: Chief Complaint  Patient presents with   Left Foot - Pain    HPI patient is a very pleasant 72 year old female who comes in today with left foot pain.  The pain she has has been ongoing for about 2 months following a trip to the beach.  The pain is to the lateral foot where she has associated swelling.  No specific aggravators.  She takes an occasional ibuprofen.  She does have a history of osteoporosis as well as stress fractures in the past.  Review of Systems as detailed in HPI.  All others reviewed and are negative.   Objective: Vital Signs: There were no vitals taken for this visit.  Physical Exam well-developed well-nourished female no acute  distress.  Alert and oriented x 3.  Ortho Exam left foot exam shows no swelling.  Very minimal tenderness to the distal fifth metatarsal and dorsum of the midfoot.  Painless range of motion of the foot and ankle.  Calf is soft and nontender.  She is neurovascularly intact distally.  Specialty Comments:  No specialty comments available.  Imaging: XR Foot Complete Left  Result Date: 04/05/2023 No acute or structural abnormalities.  VAS [place] LOWER EXTREMITY VENOUS (DVT)  Result Date: 04/05/2023  Lower Venous DVT Study Patient Name:  Victoria Carlson  Date of Exam:   04/05/2023 Medical Rec #: [id number]       Accession #:    [id number] Date of Birth: 24-Jul-1951       Patient Gender: F Patient Age:   56 years Exam Location:  [facility] Procedure:      VAS [place] LOWER EXTREMITY VENOUS (DVT) Referring Phys: Coralyn Mark Malene Blaydes --------------------------------------------------------------------------------  Indications: Left foot pain.  Comparison Study: No previous exams Performing Technologist: Jody Hill RVT, RDMS  Examination Guidelines: A complete evaluation includes B-mode imaging, spectral Doppler, color Doppler, and power Doppler as needed of all accessible portions of each vessel. Bilateral testing is considered an integral part of  a complete examination. Limited examinations for reoccurring indications may be performed as noted. The reflux portion of the exam is performed with the patient in reverse Trendelenburg.  +-----+---------------+---------+-----------+----------+--------------+ RIGHTCompressibilityPhasicitySpontaneityPropertiesThrombus Aging +-----+---------------+---------+-----------+----------+--------------+ CFV  Full           Yes      Yes                                 +-----+---------------+---------+-----------+----------+--------------+   +---------+---------------+---------+-----------+----------+--------------+ LEFT      CompressibilityPhasicitySpontaneityPropertiesThrombus Aging +---------+---------------+---------+-----------+----------+--------------+ CFV      Full           Yes      Yes                                 +---------+---------------+---------+-----------+----------+--------------+ SFJ      Full                                                        +---------+---------------+---------+-----------+----------+--------------+ FV Prox  Full           Yes      Yes                                 +---------+---------------+---------+-----------+----------+--------------+ FV Mid   Full           Yes      Yes                                 +---------+---------------+---------+-----------+----------+--------------+ FV DistalFull           Yes      Yes                                 +---------+---------------+---------+-----------+----------+--------------+ PFV      Full                                                        +---------+---------------+---------+-----------+----------+--------------+ POP      Full           Yes      Yes                                 +---------+---------------+---------+-----------+----------+--------------+ PTV      Full                                                        +---------+---------------+---------+-----------+----------+--------------+ PERO     Full                                                        +---------+---------------+---------+-----------+----------+--------------+  Summary: RIGHT: - No evidence of common femoral vein obstruction.  LEFT: - There is no evidence of deep vein thrombosis in the lower extremity.  - No cystic structure found in the popliteal fossa.  *See table(s) above for measurements and observations. Electronically signed by Heath Lark on 04/05/2023 at 5:37:54 PM.    Final      PMFS History: There are no problems to display for this patient.  Past Medical History:   Diagnosis Date   Fatty liver    Osteoporosis    Retinal hemorrhage    Stress fracture of foot     Family History  Problem Relation Age of Onset   Prostate cancer Father    Colon cancer Neg Hx     Past Surgical History:  Procedure Laterality Date   BREAST LUMPECTOMY     Social History   Occupational History   Not on file  Tobacco Use   Smoking status: Never   Smokeless tobacco: Never  Substance and Sexual Activity   Alcohol use: Yes    Comment: ocassional; "maybe 2 glasses wine per month" per pt./RM   Drug use: No   Sexual activity: Not on file

## 2023-07-11 DIAGNOSIS — Z1231 Encounter for screening mammogram for malignant neoplasm of breast: Secondary | ICD-10-CM | POA: Diagnosis not present

## 2023-08-28 DIAGNOSIS — H35371 Puckering of macula, right eye: Secondary | ICD-10-CM | POA: Diagnosis not present

## 2023-09-05 DIAGNOSIS — R69 Illness, unspecified: Secondary | ICD-10-CM | POA: Diagnosis not present

## 2023-09-17 DIAGNOSIS — H43813 Vitreous degeneration, bilateral: Secondary | ICD-10-CM | POA: Diagnosis not present

## 2023-09-17 DIAGNOSIS — H34831 Tributary (branch) retinal vein occlusion, right eye, with macular edema: Secondary | ICD-10-CM | POA: Diagnosis not present

## 2023-09-17 DIAGNOSIS — H2513 Age-related nuclear cataract, bilateral: Secondary | ICD-10-CM | POA: Diagnosis not present

## 2023-10-10 DIAGNOSIS — H34831 Tributary (branch) retinal vein occlusion, right eye, with macular edema: Secondary | ICD-10-CM | POA: Diagnosis not present

## 2023-10-23 DIAGNOSIS — Z1212 Encounter for screening for malignant neoplasm of rectum: Secondary | ICD-10-CM | POA: Diagnosis not present

## 2023-10-24 ENCOUNTER — Other Ambulatory Visit: Payer: Self-pay | Admitting: Endocrinology

## 2023-10-24 DIAGNOSIS — H3560 Retinal hemorrhage, unspecified eye: Secondary | ICD-10-CM | POA: Diagnosis not present

## 2023-10-24 DIAGNOSIS — N1831 Chronic kidney disease, stage 3a: Secondary | ICD-10-CM | POA: Diagnosis not present

## 2023-10-24 DIAGNOSIS — Z1339 Encounter for screening examination for other mental health and behavioral disorders: Secondary | ICD-10-CM | POA: Diagnosis not present

## 2023-10-24 DIAGNOSIS — R1031 Right lower quadrant pain: Secondary | ICD-10-CM

## 2023-10-24 DIAGNOSIS — Z1331 Encounter for screening for depression: Secondary | ICD-10-CM | POA: Diagnosis not present

## 2023-10-24 DIAGNOSIS — R82998 Other abnormal findings in urine: Secondary | ICD-10-CM | POA: Diagnosis not present

## 2023-10-24 DIAGNOSIS — M810 Age-related osteoporosis without current pathological fracture: Secondary | ICD-10-CM | POA: Diagnosis not present

## 2023-10-24 DIAGNOSIS — Z0000 Encounter for general adult medical examination without abnormal findings: Secondary | ICD-10-CM | POA: Diagnosis not present

## 2023-10-24 DIAGNOSIS — E049 Nontoxic goiter, unspecified: Secondary | ICD-10-CM | POA: Diagnosis not present

## 2023-10-24 DIAGNOSIS — K760 Fatty (change of) liver, not elsewhere classified: Secondary | ICD-10-CM | POA: Diagnosis not present

## 2023-10-25 ENCOUNTER — Encounter: Payer: Self-pay | Admitting: Endocrinology

## 2023-11-09 DIAGNOSIS — H43813 Vitreous degeneration, bilateral: Secondary | ICD-10-CM | POA: Diagnosis not present

## 2023-11-09 DIAGNOSIS — H34831 Tributary (branch) retinal vein occlusion, right eye, with macular edema: Secondary | ICD-10-CM | POA: Diagnosis not present

## 2023-11-09 DIAGNOSIS — H2513 Age-related nuclear cataract, bilateral: Secondary | ICD-10-CM | POA: Diagnosis not present

## 2023-11-14 ENCOUNTER — Encounter: Payer: Self-pay | Admitting: Endocrinology

## 2023-11-15 ENCOUNTER — Ambulatory Visit
Admission: RE | Admit: 2023-11-15 | Discharge: 2023-11-15 | Disposition: A | Discharge status: Home / Self Care | Payer: Medicare Other | Source: Ambulatory Visit | Attending: Endocrinology | Admitting: Endocrinology

## 2023-11-15 DIAGNOSIS — R1031 Right lower quadrant pain: Secondary | ICD-10-CM | POA: Diagnosis not present

## 2023-11-15 MED ORDER — IOPAMIDOL (ISOVUE-300) INJECTION 61%
100.0000 mL | Freq: Once | INTRAVENOUS | Status: AC | PRN
  Administered 2023-11-15: 85 mL via INTRAVENOUS

## 2023-12-07 DIAGNOSIS — H43813 Vitreous degeneration, bilateral: Secondary | ICD-10-CM | POA: Diagnosis not present

## 2023-12-07 DIAGNOSIS — H34831 Tributary (branch) retinal vein occlusion, right eye, with macular edema: Secondary | ICD-10-CM | POA: Diagnosis not present

## 2023-12-07 DIAGNOSIS — H2513 Age-related nuclear cataract, bilateral: Secondary | ICD-10-CM | POA: Diagnosis not present

## 2024-01-04 DIAGNOSIS — H2513 Age-related nuclear cataract, bilateral: Secondary | ICD-10-CM | POA: Diagnosis not present

## 2024-01-04 DIAGNOSIS — H43813 Vitreous degeneration, bilateral: Secondary | ICD-10-CM | POA: Diagnosis not present

## 2024-01-04 DIAGNOSIS — H34831 Tributary (branch) retinal vein occlusion, right eye, with macular edema: Secondary | ICD-10-CM | POA: Diagnosis not present

## 2024-02-04 DIAGNOSIS — H34831 Tributary (branch) retinal vein occlusion, right eye, with macular edema: Secondary | ICD-10-CM | POA: Diagnosis not present

## 2024-02-04 DIAGNOSIS — H2513 Age-related nuclear cataract, bilateral: Secondary | ICD-10-CM | POA: Diagnosis not present

## 2024-02-04 DIAGNOSIS — H43813 Vitreous degeneration, bilateral: Secondary | ICD-10-CM | POA: Diagnosis not present

## 2024-03-18 DIAGNOSIS — H259 Unspecified age-related cataract: Secondary | ICD-10-CM | POA: Diagnosis not present

## 2024-03-21 DIAGNOSIS — H43813 Vitreous degeneration, bilateral: Secondary | ICD-10-CM | POA: Diagnosis not present

## 2024-03-21 DIAGNOSIS — H34831 Tributary (branch) retinal vein occlusion, right eye, with macular edema: Secondary | ICD-10-CM | POA: Diagnosis not present

## 2024-03-21 DIAGNOSIS — H2513 Age-related nuclear cataract, bilateral: Secondary | ICD-10-CM | POA: Diagnosis not present

## 2024-03-27 DIAGNOSIS — K080 Exfoliation of teeth due to systemic causes: Secondary | ICD-10-CM | POA: Diagnosis not present

## 2024-05-06 DIAGNOSIS — H43813 Vitreous degeneration, bilateral: Secondary | ICD-10-CM | POA: Diagnosis not present

## 2024-05-06 DIAGNOSIS — H2513 Age-related nuclear cataract, bilateral: Secondary | ICD-10-CM | POA: Diagnosis not present

## 2024-05-06 DIAGNOSIS — H34831 Tributary (branch) retinal vein occlusion, right eye, with macular edema: Secondary | ICD-10-CM | POA: Diagnosis not present

## 2024-06-17 DIAGNOSIS — H43813 Vitreous degeneration, bilateral: Secondary | ICD-10-CM | POA: Diagnosis not present

## 2024-06-17 DIAGNOSIS — H2513 Age-related nuclear cataract, bilateral: Secondary | ICD-10-CM | POA: Diagnosis not present

## 2024-06-17 DIAGNOSIS — H34831 Tributary (branch) retinal vein occlusion, right eye, with macular edema: Secondary | ICD-10-CM | POA: Diagnosis not present

## 2024-07-16 DIAGNOSIS — Z1231 Encounter for screening mammogram for malignant neoplasm of breast: Secondary | ICD-10-CM | POA: Diagnosis not present

## 2024-07-29 DIAGNOSIS — H43813 Vitreous degeneration, bilateral: Secondary | ICD-10-CM | POA: Diagnosis not present

## 2024-07-29 DIAGNOSIS — H34831 Tributary (branch) retinal vein occlusion, right eye, with macular edema: Secondary | ICD-10-CM | POA: Diagnosis not present

## 2024-07-29 DIAGNOSIS — H2513 Age-related nuclear cataract, bilateral: Secondary | ICD-10-CM | POA: Diagnosis not present

## 2024-09-09 DIAGNOSIS — H2513 Age-related nuclear cataract, bilateral: Secondary | ICD-10-CM | POA: Diagnosis not present

## 2024-09-09 DIAGNOSIS — H43813 Vitreous degeneration, bilateral: Secondary | ICD-10-CM | POA: Diagnosis not present

## 2024-09-09 DIAGNOSIS — H34831 Tributary (branch) retinal vein occlusion, right eye, with macular edema: Secondary | ICD-10-CM | POA: Diagnosis not present
